# Patient Record
Sex: Female | Born: 1980 | Smoking: Former smoker
Health system: Southern US, Community
[De-identification: ages and names within clinical notes are randomized; demographics above are authoritative.]

---
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
## 2005-09-12 ENCOUNTER — Ambulatory Visit: Payer: Self-pay | Admitting: Internal Medicine

## 2007-02-06 ENCOUNTER — Ambulatory Visit: Payer: Self-pay

## 2007-02-07 ENCOUNTER — Inpatient Hospital Stay: Payer: Self-pay

## 2014-03-30 LAB — LIPID PANEL
CHOLESTEROL: 186 mg/dL (ref 0–200)
HDL: 59 mg/dL (ref 35–70)
LDL Cholesterol: 111 mg/dL
TRIGLYCERIDES: 79 mg/dL (ref 40–160)

## 2014-03-30 LAB — CBC AND DIFFERENTIAL
HCT: 39 % (ref 36–46)
HEMOGLOBIN: 13 g/dL (ref 12.0–16.0)
Platelets: 178 10*3/uL (ref 150–399)
WBC: 7.3 10^3/mL

## 2014-03-30 LAB — BASIC METABOLIC PANEL
BUN: 10 mg/dL (ref 4–21)
CREATININE: 0.9 mg/dL (ref 0.5–1.1)
Glucose: 92 mg/dL
POTASSIUM: 4.1 mmol/L (ref 3.4–5.3)
Sodium: 141 mmol/L (ref 137–147)

## 2014-03-30 LAB — TSH: TSH: 2.12 u[IU]/mL (ref 0.41–5.90)

## 2014-03-30 LAB — HEPATIC FUNCTION PANEL
ALT: 10 U/L (ref 7–35)
AST: 13 U/L (ref 13–35)

## 2014-03-30 LAB — HM PAP SMEAR: HM PAP: NEGATIVE

## 2015-04-01 ENCOUNTER — Other Ambulatory Visit: Payer: Self-pay | Admitting: Family Medicine

## 2015-04-01 DIAGNOSIS — Z308 Encounter for other contraceptive management: Secondary | ICD-10-CM

## 2015-04-01 DIAGNOSIS — Z309 Encounter for contraceptive management, unspecified: Secondary | ICD-10-CM | POA: Insufficient documentation

## 2015-04-01 NOTE — Telephone Encounter (Signed)
LAST CPE 03/30/14

## 2015-06-29 ENCOUNTER — Other Ambulatory Visit: Payer: Self-pay | Admitting: Family Medicine

## 2015-06-29 DIAGNOSIS — Z308 Encounter for other contraceptive management: Secondary | ICD-10-CM

## 2015-06-29 MED ORDER — LEVONORGESTREL-ETHINYL ESTRAD 0.1-20 MG-MCG PO TABS: 1.0000 | ORAL_TABLET | Freq: Every day | ORAL | Status: DC

## 2015-06-29 NOTE — Telephone Encounter (Signed)
Needs refill on ORSYTHIA 0.1-20 MG-MCG tablet 04/01/15 -- Lorie Phenix, MD TAKE 1 TABLET BY MOUTH EVERY DAY  Has an appt Nov 1 CPE  [facility] uses [facility] /dr.  Hermina Barters

## 2015-07-19 DIAGNOSIS — K589 Irritable bowel syndrome without diarrhea: Secondary | ICD-10-CM | POA: Insufficient documentation

## 2015-07-19 DIAGNOSIS — IMO0002 Reserved for concepts with insufficient information to code with codable children: Secondary | ICD-10-CM | POA: Insufficient documentation

## 2015-07-20 ENCOUNTER — Ambulatory Visit (INDEPENDENT_AMBULATORY_CARE_PROVIDER_SITE_OTHER): Payer: BC Managed Care – PPO | Admitting: Family Medicine

## 2015-07-20 ENCOUNTER — Encounter: Payer: Self-pay | Admitting: Family Medicine

## 2015-07-20 VITALS — BP 120/76 | HR 92 | Temp 98.3°F | Resp 16 | Ht 64.0 in | Wt 189.0 lb

## 2015-07-20 DIAGNOSIS — J020 Streptococcal pharyngitis: Secondary | ICD-10-CM | POA: Insufficient documentation

## 2015-07-20 DIAGNOSIS — J029 Acute pharyngitis, unspecified: Secondary | ICD-10-CM

## 2015-07-20 DIAGNOSIS — J309 Allergic rhinitis, unspecified: Secondary | ICD-10-CM | POA: Insufficient documentation

## 2015-07-20 DIAGNOSIS — J3089 Other allergic rhinitis: Secondary | ICD-10-CM | POA: Diagnosis not present

## 2015-07-20 LAB — POCT RAPID STREP A (OFFICE): Rapid Strep A Screen: POSITIVE — AB

## 2015-07-20 MED ORDER — FLUTICASONE PROPIONATE 50 MCG/ACT NA SUSP: 2.0000 | Freq: Every day | NASAL | Status: DC

## 2015-07-20 MED ORDER — CEPHALEXIN 500 MG PO CAPS: 500.0000 mg | ORAL_CAPSULE | Freq: Three times a day (TID) | ORAL | Status: DC

## 2015-07-20 NOTE — Progress Notes (Signed)
Patient ID: Megan Spencer, female   DOB: 04/22/1981, 34 y.o.   MRN: [id number]        Patient: Megan Spencer Female    DOB: 12/09/1980   34 y.o.   MRN: [id number] Visit Date: 07/20/2015  Today's Provider: Lorie PhenixNancy Chandra Feger, MD   Chief Complaint  Patient presents with  . Sore Throat   Subjective:    HPI  Fever: Patient presents with low grade fevers. She has had the fever for 1 day.  Symptoms have been stable. Symptoms associated with the fever include sore throat, and patient denies body aches and chills. Symptoms have been stable since that time. Patient has been sleeping well. Appetite has been completed. Urine output has been completed.  She has tried to alleviate the symptoms with OTC nasal decongestion with minimal relief. Daycare:no. Exposure to tobacco: no. Exposure to someone else at home w/similar symptoms:no. Exposure to someone else at daycare/school/work:no. Patient reports that she has had several strep infections in the past. Patient reports left side of throat is more painful and swollen than right. Patient also reports left ear pain and cough. Patient denies shortness of breath or wheezing.     Has history of strep in the past.   No current treatment for allergies.   Results for orders placed or performed in visit on 07/20/15  POCT rapid strep A  Result Value Ref Range   Rapid Strep A Screen Positive (A) Negative     Allergies  Allergen Reactions  . Codeine   . Neomycin-Bacitracin Zn-Polymyx   . Sulfa Antibiotics   . Tape    Previous Medications   LEVONORGESTREL-ETHINYL ESTRADIOL (ORSYTHIA) 0.1-20 MG-MCG TABLET    Take 1 tablet by mouth daily.    Review of Systems  Constitutional: Positive for fever.  HENT: Positive for ear pain, postnasal drip, rhinorrhea and trouble swallowing.   Respiratory: Positive for cough.     Social History  Substance Use Topics  . Smoking status: Former Smoker    Quit date: 09/17/2001  . Smokeless tobacco: Not on file  .  Alcohol Use: No   Objective:   BP 120/76 mmHg  Pulse 92  Temp(Src) 98.3 F (36.8 C) (Oral)  Resp 16  Ht 5\' 4"  (1.626 m)  Wt 189 lb (85.73 kg)  BMI 32.43 kg/m2  SpO2 99%  LMP 06/30/2015 (Exact Date)  Physical Exam  Constitutional: She is oriented to person, place, and time. She appears well-developed and well-nourished.  HENT:  Head: Normocephalic and atraumatic.  Right Ear: Tympanic membrane and external ear normal.  Left Ear: Tympanic membrane and external ear normal.  Nose: Mucosal edema and rhinorrhea present. Right sinus exhibits maxillary sinus tenderness. Left sinus exhibits maxillary sinus tenderness.  Mouth/Throat: Uvula is midline. Oropharyngeal exudate present.  2 plus tonsils.   Eyes: Conjunctivae and EOM are normal. Pupils are equal, round, and reactive to light.  Neck: Normal range of motion. Neck supple.  Cardiovascular: Normal rate and regular rhythm.   Pulmonary/Chest: Effort normal and breath sounds normal. She has no wheezes. She has no rales.  Neurological: She is alert and oriented to person, place, and time.      Assessment & Plan:      1. Sore throat Strep positive. - POCT rapid strep A Results for orders placed or performed in visit on 07/20/15  POCT rapid strep A  Result Value Ref Range   Rapid Strep A Screen Positive (A) Negative    2. Strep throat Condition is worsening.  Will start medication for better control.  Patient instructed to call back if condition worsens or does not improve.    - cephALEXin (KEFLEX) 500 MG capsule; Take 1 capsule (500 mg total) by mouth 3 (three) times daily.  Dispense: 30 capsule; Refill: 0  3. Other allergic rhinitis Condition is worsening. Will start medication for better control.  Patient instructed to call back if condition worsens or does not improve.    - fluticasone (FLONASE) 50 MCG/ACT nasal spray; Place 2 sprays into both nostrils daily.  Dispense: 16 g; Refill: 6  Lorie Phenix, MD       Lorie Phenix, MD  [facility]

## 2015-07-20 NOTE — Progress Notes (Deleted)
Subjective:     Patient ID: Megan Spencer, female   DOB: 10/21/1980, 34 y.o.   MRN: [id number]  HPI Fever: Patient presents with {fever:5004}. She has had the fever for {numbers; 0-10:33138} {time units:11}.  Symptoms have been {clinical course - history:17::"unchanged"}. Symptoms associated with the fever include {fever associated symptoms:12577}, and patient denies {fever associated symptoms:12577}.. Symptoms are worse at {time of day:12576}.  Symptoms have been {clinical course - history:17::"unchanged"} since that time. Patient has been {sleep:10079}. Appetite has been {assess:31394}. Urine output has been {assess:31394}. She has associated symptoms of   She has tried to alleviate the symptoms with {fever alleviating factors:12578} with {relief:12621} relief.   The patient has {fever comorbidities:12579}. Daycare:{yes/no:63}. Exposure to tobacco: {yes/no:63}. Exposure to someone else at home w/similar symptoms:{yes/no:63} Exposure to someone else at daycare/school/work:{yes/no:63}.    Review of Systems  Constitutional: Negative.   HENT: Negative.   Eyes: Negative.   Respiratory: Negative.        Objective:   Physical Exam     Assessment:     ***    Plan:     ***

## 2015-07-23 ENCOUNTER — Telehealth: Payer: Self-pay | Admitting: Family Medicine

## 2015-07-23 NOTE — Telephone Encounter (Signed)
No resistance to strep with Keflex. White can be debris in tonsils that can become inflamed.  Would add warm salt water gargles and call if not improved.  Thanks.

## 2015-07-23 NOTE — Telephone Encounter (Signed)
Pt stated that she had an OV on Tuesday 07/20/15 and has been taking the meds as directed but she still has white spots on the back of her throat and still hurts and hard to swallow. Pt wanted to know if she should try anything else. [facility] [address] Lynford Humphreyhanks TNP

## 2015-07-23 NOTE — Telephone Encounter (Signed)
LMTCB 07/23/2015  Thanks,   -Laura  

## 2015-07-24 ENCOUNTER — Ambulatory Visit (INDEPENDENT_AMBULATORY_CARE_PROVIDER_SITE_OTHER): Payer: BC Managed Care – PPO | Admitting: Family Medicine

## 2015-07-24 ENCOUNTER — Encounter: Payer: Self-pay | Admitting: Family Medicine

## 2015-07-24 VITALS — BP 136/80 | HR 80 | Temp 98.3°F | Resp 16 | Wt 189.0 lb

## 2015-07-24 DIAGNOSIS — J039 Acute tonsillitis, unspecified: Secondary | ICD-10-CM

## 2015-07-24 MED ORDER — NYSTATIN 100000 UNIT/ML MT SUSP: 5.0000 mL | Freq: Four times a day (QID) | OROMUCOSAL | Status: DC

## 2015-07-24 NOTE — Progress Notes (Signed)
Subjective:     Patient ID: Salomon Fickrika P Icenhour, female   DOB: 06/23/1981, 34 y.o.   MRN: [id number]  HPI  Chief Complaint  Patient presents with  . Sore Throat    Still has white spots on the back of her throat since started Keflex on Tuesday.  . Cough  Here in f/u of o.v.of 11/1 for strep throat. Concerned about new white spots and coated tongue. States fevers and fatigue have abated and sore throat has improved.   Review of Systems     Objective:   Physical Exam  Constitutional: She appears well-developed and well-nourished. No distress.  Ears: T.M's intact without inflammation Sinuses:  Throat: marked enlarged tonsils with white exudate/erythema. Tongue is white in appearance but does not rub off with tongue blade (patient states she brushed her tongue this AM. Neck: + left anterior cervical node Lungs: clear     Assessment:    1. Tonsillitis: will cover for thrush  - Epstein-Barr virus VCA antibody panel - nystatin (MYCOSTATIN) 100000 UNIT/ML suspension; Take 5 mLs (500,000 Units total) by mouth 4 (four) times daily.  Dispense: 60 mL; Refill: 0    Plan:    Complete antibiotic. Obtain labs if not continuing to improve.

## 2015-07-24 NOTE — Patient Instructions (Signed)
We will call you with lab results. Complete antibiotic.

## 2015-07-26 NOTE — Telephone Encounter (Signed)
Pt made OV for 07/24/2015  Thanks,   -Vernona RiegerLaura

## 2015-07-28 ENCOUNTER — Telehealth: Payer: Self-pay | Admitting: Family Medicine

## 2015-07-28 LAB — EPSTEIN-BARR VIRUS VCA ANTIBODY PANEL
EBV EARLY ANTIGEN AB, IGG: 120 U/mL — AB (ref 0.0–8.9)
EBV NA IgG: >600 U/mL — ABNORMAL HIGH (ref 0.0–17.9)
EBV VCA IgG: 589 U/mL — ABNORMAL HIGH (ref 0.0–17.9)

## 2015-07-28 NOTE — Telephone Encounter (Signed)
Pt stated she was returning a call from yesterday. Pt stated the person didn't their name on the message just call the office. Thanks TNP

## 2015-07-28 NOTE — Telephone Encounter (Signed)
We received a message from the call center saying that patient was not any better. Patient reports that she was seen on Saturday and was treated for Mono. Patient reports that she just had her labs done yesterday morning. Advised patient that we will advise her once lab results come in.

## 2015-08-03 ENCOUNTER — Encounter: Payer: Self-pay | Admitting: Family Medicine

## 2015-08-03 ENCOUNTER — Ambulatory Visit (INDEPENDENT_AMBULATORY_CARE_PROVIDER_SITE_OTHER): Payer: BC Managed Care – PPO | Admitting: Family Medicine

## 2015-08-03 VITALS — BP 124/60 | HR 80 | Temp 98.3°F | Resp 16 | Wt 188.0 lb

## 2015-08-03 DIAGNOSIS — Z0000 Encounter for general adult medical examination without abnormal findings: Secondary | ICD-10-CM | POA: Diagnosis not present

## 2015-08-03 DIAGNOSIS — Z308 Encounter for other contraceptive management: Secondary | ICD-10-CM

## 2015-08-03 DIAGNOSIS — Z124 Encounter for screening for malignant neoplasm of cervix: Secondary | ICD-10-CM

## 2015-08-03 MED ORDER — LEVONORGESTREL-ETHINYL ESTRAD 0.1-20 MG-MCG PO TABS: 1.0000 | ORAL_TABLET | Freq: Every day | ORAL | Status: DC

## 2015-08-03 NOTE — Progress Notes (Signed)
Patient ID: Megan Spencer, female   DOB: 08/11/1981, 34 y.o.   MRN: [id number]        Patient: Megan Spencer, Female    DOB: 04/04/1981, 34 y.o.   MRN: [id number] Visit Date: 08/03/2015  Today's Provider: Lorie PhenixNancy Jacobi Ryant, MD   Chief Complaint  Patient presents with  . Annual Exam   Subjective:    Annual physical exam Megan Spencer is a 34 y.o. female who presents today for health maintenance and complete physical. She feels fairly well. Pt reports still having trouble with a cough.  She reports exercising some, but stays very active. She reports she is sleeping well.  -----------------------------------------------------------------   Review of Systems  Constitutional: Negative.   HENT: Negative.   Respiratory: Positive for cough.   Cardiovascular: Negative.   Gastrointestinal: Negative.   Endocrine: Negative.   Genitourinary: Negative.   Musculoskeletal: Positive for arthralgias.  Skin: Negative.   Allergic/Immunologic: Negative.   Neurological: Negative.   Hematological: Negative.   Psychiatric/Behavioral: Negative.     Social History She  reports that she quit smoking about 13 years ago. She does not have any smokeless tobacco history on file. She reports that she does not drink alcohol or use illicit drugs. Social History   Social History  . Marital Status: Married    Spouse Name: N/A  . Number of Children: N/A  . Years of Education: N/A   Social History Main Topics  . Smoking status: Former Smoker    Quit date: 09/17/2001  . Smokeless tobacco: None  . Alcohol Use: No  . Drug Use: No  . Sexual Activity: Not Asked   Other Topics Concern  . None   Social History Narrative    Patient Active Problem List   Diagnosis Date Noted  . Annual physical exam 08/03/2015  . Strep throat 07/20/2015  . Allergic rhinitis 07/20/2015  . Abnormal cervical Pap smear with positive HPV DNA test 07/19/2015  . IBS (irritable bowel syndrome) 07/19/2015  .  Contraception management 04/01/2015    Past Surgical History  Procedure Laterality Date  . Cesarean section  2008  . Knee surgery Right 1998    arthroscopy    Family History  Family Status  Relation Status Death Age  . Mother Alive   . Father Deceased [id number]    lung cancer  . Sister Alive    Her family history includes Psoriasis in her sister.    Allergies  Allergen Reactions  . Codeine   . Neomycin-Bacitracin Zn-Polymyx   . Sulfa Antibiotics   . Tape     Previous Medications   FLUTICASONE (FLONASE) 50 MCG/ACT NASAL SPRAY    Place 2 sprays into both nostrils daily.    Patient Care Team: Lorie PhenixNancy Tywone Bembenek, MD as PCP - General (Family Medicine)     Objective:   Vitals: BP 124/60 mmHg  Pulse 80  Temp(Src) 98.3 F (36.8 C) (Oral)  Resp 16  Wt 188 lb (85.276 kg)  LMP 07/21/2015 (Exact Date)   Physical Exam  Constitutional: She is oriented to person, place, and time. She appears well-developed and well-nourished.  HENT:  Head: Normocephalic and atraumatic.  Right Ear: External ear normal.  Left Ear: External ear normal.  Nose: Nose normal.  Mouth/Throat: Oropharynx is clear and moist.  Eyes: Conjunctivae and EOM are normal. Pupils are equal, round, and reactive to light.  Neck: Trachea normal and normal range of motion. Neck supple. Carotid bruit is not present. No thyromegaly present.  Cardiovascular: Normal  rate, regular rhythm, normal heart sounds and intact distal pulses.   Pulmonary/Chest: Effort normal and breath sounds normal.  Abdominal: Soft. Bowel sounds are normal. She exhibits no distension. There is no tenderness.  Genitourinary: Rectum normal, vagina normal and uterus normal. Guaiac negative stool. No breast swelling, tenderness, discharge or bleeding. Cervix exhibits no discharge.  Musculoskeletal: Normal range of motion.  Lymphadenopathy:    She has no cervical adenopathy.    She has no axillary adenopathy.  Neurological: She is alert and oriented to  person, place, and time. She has normal reflexes.  Skin: Skin is warm and dry.  Psychiatric: She has a normal mood and affect. Her speech is normal and behavior is normal. Judgment and thought content normal. Cognition and memory are normal.     Depression Screen No flowsheet data found.    Assessment & Plan:     Routine Health Maintenance and Physical Exam  Exercise Activities and Dietary recommendations Goals    None       There is no immunization history on file for this patient.  Health Maintenance  Topic Date Due  . HIV Screening  02/18/1994  . TETANUS/TDAP  02/18/1998  . INFLUENZA VACCINE  04/19/2015  . PAP SMEAR  03/30/2017      Discussed health benefits of physical activity, and encouraged her to engage in regular exercise appropriate for her age and condition.   1. Annual physical exam As above.  May have has Mono. Will get flu shot next week.    2. Cervical cancer screening Done today.  - Pap IG and HPV (high risk) DNA detection  3. Encounter for other contraceptive management Rx sent.  - levonorgestrel-ethinyl estradiol (ORSYTHIA) 0.1-20 MG-MCG tablet; Take 1 tablet by mouth daily.  Dispense: 28 tablet; Refill: 11   Lorie Phenix, MD    --------------------------------------------------------------------

## 2015-08-06 ENCOUNTER — Telehealth: Payer: Self-pay

## 2015-08-06 LAB — PAP IG AND HPV HIGH-RISK
HPV, HIGH-RISK: NEGATIVE
PAP SMEAR COMMENT: 0

## 2015-08-06 NOTE — Telephone Encounter (Signed)
Advised pt of lab results. Pt verbally acknowledges understanding. Emily Drozdowski, CMA   

## 2015-08-06 NOTE — Telephone Encounter (Signed)
-----   Message from Lorie PhenixNancy Maloney, MD sent at 08/06/2015  9:34 AM EST ----- Pap  Normal. HPV negative. Thanks.

## 2015-08-24 ENCOUNTER — Ambulatory Visit (INDEPENDENT_AMBULATORY_CARE_PROVIDER_SITE_OTHER): Payer: BC Managed Care – PPO | Admitting: Family Medicine

## 2015-08-24 DIAGNOSIS — Z23 Encounter for immunization: Secondary | ICD-10-CM

## 2016-01-11 ENCOUNTER — Other Ambulatory Visit: Payer: Self-pay | Admitting: Otolaryngology

## 2016-01-11 DIAGNOSIS — R42 Dizziness and giddiness: Secondary | ICD-10-CM

## 2016-07-11 ENCOUNTER — Encounter: Payer: Self-pay | Admitting: Physician Assistant

## 2016-07-11 ENCOUNTER — Ambulatory Visit (INDEPENDENT_AMBULATORY_CARE_PROVIDER_SITE_OTHER): Payer: BC Managed Care – PPO | Admitting: Physician Assistant

## 2016-07-11 VITALS — BP 138/88 | HR 76 | Temp 99.0°F | Resp 16 | Wt 190.0 lb

## 2016-07-11 DIAGNOSIS — H15102 Unspecified episcleritis, left eye: Secondary | ICD-10-CM | POA: Diagnosis not present

## 2016-07-11 MED ORDER — OFLOXACIN 0.3 % OP SOLN
1.0000 [drp] | Freq: Four times a day (QID) | OPHTHALMIC | 0 refills | Status: AC
Start: 2016-07-11 — End: 2016-07-18

## 2016-07-11 NOTE — Progress Notes (Addendum)
Patient: Megan Spencer Female    DOB: 12/08/1980   35 y.o.   MRN: [id number] Visit Date: 07/11/2016  Today's Provider: Trey SailorsAdriana M Pollak, PA-C   Chief Complaint  Patient presents with  . Eye Problem    Started about 10 days ago.    Subjective:    Eye Problem   The left eye is affected. This is a new problem. The current episode started 1 to 4 weeks ago. The problem has been waxing and waning. There was no injury mechanism. There is no known exposure to pink eye. She does not wear contacts. Associated symptoms include an eye discharge, eye redness, itching and photophobia. Pertinent negatives include no blurred vision, double vision or foreign body sensation. She has tried eye drops for the symptoms. The treatment provided no relief.   She describes a localized redness associated with itching and watering. She denies purulent drainage or morning crusting. No change in vision. Some photophobia that has since dissipated. No foreign object feeling in eyes. Problem waxing and waning for 10 days. Has tried her son's tobramcyin drops without relief.  Allergies  Allergen Reactions  . Codeine   . Neomycin-Bacitracin Zn-Polymyx   . Sulfa Antibiotics   . Tape      Current Outpatient Prescriptions:  .  levonorgestrel-ethinyl estradiol (ORSYTHIA) 0.1-20 MG-MCG tablet, Take 1 tablet by mouth daily., Disp: 28 tablet, Rfl: 11 .  ofloxacin (OCUFLOX) 0.3 % ophthalmic solution, Place 1 drop into the left eye 4 (four) times daily., Disp: 1.4 mL, Rfl: 0  Review of Systems  Constitutional: Negative.   HENT: Positive for tinnitus. Negative for congestion, ear discharge, ear pain, nosebleeds, postnasal drip, rhinorrhea, sinus pressure, sneezing, sore throat and trouble swallowing.   Eyes: Positive for photophobia, pain, discharge, redness and itching. Negative for blurred vision, double vision and visual disturbance.  Allergic/Immunologic: Positive for environmental allergies.  Neurological:  Negative for dizziness, light-headedness and headaches.    Social History  Substance Use Topics  . Smoking status: Former Smoker    Quit date: 09/17/2001  . Smokeless tobacco: Not on file  . Alcohol use No   Objective:   BP 138/88 (BP Location: Left Arm, Patient Position: Sitting, Cuff Size: Large)   Pulse 76   Temp 99 F (37.2 C) (Oral)   Resp 16   Wt 190 lb (86.2 kg)   LMP 06/21/2016   BMI 32.61 kg/m   Physical Exam  Constitutional: She appears well-developed and well-nourished.  Eyes: EOM and lids are normal. Pupils are equal, round, and reactive to light. Lids are everted and swept, no foreign bodies found. Right eye exhibits no discharge, no exudate and no hordeolum. No foreign body present in the right eye. Left eye exhibits no discharge, no exudate and no hordeolum. No foreign body present in the left eye. Right conjunctiva is not injected. Right conjunctiva has no hemorrhage. Left conjunctiva is injected. Left conjunctiva has no hemorrhage.    Lymphadenopathy:    She has no cervical adenopathy.        Assessment & Plan:      Problem List Items Addressed This Visit    None    Visit Diagnoses    Episcleritis of left eye    -  Primary   Relevant Medications   ofloxacin (OCUFLOX) 0.3 % ophthalmic solution     Patient is 35 y/o presenting with episcleritis. Explained this is self limiting in nature, can be treated with lubricating eye drops. Patient  does work as Art gallery manager, so have provided antibiotic eye drops if patient develops purulent drainage, morning crusting. Patient to call back if not improving.   Return if symptoms worsen or fail to improve.  The entirety of the information documented in the History of Present Illness, Review of Systems and Physical Exam were personally obtained by me. Portions of this information were initially documented by Kavin Leech, CMA and reviewed by me for thoroughness and accuracy.    Patient Instructions    Scleritis and Episcleritis The outer part of the eyeball is covered with a tough fibrous covering called the sclera. It is the white part of the eye. This tough covering also has a thin membrane lying on top of it called the episclera.   When the sclera becomes red and sore (inflamed), it is called scleritis.  When the episclera becomes inflamed, it is called episcleritis. CAUSES   Scleritis is usually more severe and is associated with autoimmune diseases such as:  Rheumatoid arthritis.  Inflammations of the bowel such as Crohn's Disease (regional enteritis).  Ulcerative colitis.  Episcleritis usually has no known cause. SYMPTOMS  Both scleritis and episcleritis cause red patches or a nodule on the eye. DIAGNOSIS  This condition should be examined by an ophthalmologist. This is because very strong medications that have side effects to the body and eye may be required to treat severe attacks. Further investigations into the patient's general health may be necessary. TREATMENT   Episcleritis tends to get better without treatment within a week or two.  Scleritis is more severe. Often, your caregiver will prescribe steroids by mouth (orally) or as drops in the eye. This treatment helps lessen the redness and soreness (inflammation). HOME CARE INSTRUCTIONS   Take all medications as directed.  Keep your follow-up appointments as directed.  Avoid irritation of the involved eye(s).  Stop using hard or soft contact lenses until your caregiver tells you that it is safe to use them. SEEK MEDICAL CARE IF:   Redness or irritation gets worse.  You develop pain or sensitivity to light.  You develop any change in vision in the involved eye(s).   This information is not intended to replace advice given to you by your health care provider. Make sure you discuss any questions you have with your health care provider.   Document Released: 08/29/2001 Document Revised: 11/27/2011 Document  Reviewed: 12/31/2008 Elsevier Interactive Patient Education 2016 [employer].          Trey Sailors, PA-C  [facility]

## 2016-07-11 NOTE — Patient Instructions (Signed)
Scleritis and Episcleritis  The outer part of the eyeball is covered with a tough fibrous covering called the sclera. It is the white part of the eye. This tough covering also has a thin membrane lying on top of it called the episclera.   · When the sclera becomes red and sore (inflamed), it is called scleritis.  · When the episclera becomes inflamed, it is called episcleritis.  CAUSES   · Scleritis is usually more severe and is associated with autoimmune diseases such as:  ¨ Rheumatoid arthritis.  ¨ Inflammations of the bowel such as Crohn's Disease (regional enteritis).  ¨ Ulcerative colitis.  · Episcleritis usually has no known cause.  SYMPTOMS   Both scleritis and episcleritis cause red patches or a nodule on the eye.  DIAGNOSIS   This condition should be examined by an ophthalmologist. This is because very strong medications that have side effects to the body and eye may be required to treat severe attacks. Further investigations into the patient's general health may be necessary.  TREATMENT   · Episcleritis tends to get better without treatment within a week or two.  · Scleritis is more severe. Often, your caregiver will prescribe steroids by mouth (orally) or as drops in the eye. This treatment helps lessen the redness and soreness (inflammation).  HOME CARE INSTRUCTIONS   · Take all medications as directed.  · Keep your follow-up appointments as directed.  · Avoid irritation of the involved eye(s).  · Stop using hard or soft contact lenses until your caregiver tells you that it is safe to use them.  SEEK MEDICAL CARE IF:   · Redness or irritation gets worse.  · You develop pain or sensitivity to light.  · You develop any change in vision in the involved eye(s).     This information is not intended to replace advice given to you by your health care provider. Make sure you discuss any questions you have with your health care provider.     Document Released: 08/29/2001 Document Revised: 11/27/2011 Document  Reviewed: 12/31/2008  Elsevier Interactive Patient Education ©2016 [employer]

## 2016-07-13 ENCOUNTER — Ambulatory Visit (INDEPENDENT_AMBULATORY_CARE_PROVIDER_SITE_OTHER): Payer: BC Managed Care – PPO | Admitting: Physician Assistant

## 2016-07-13 ENCOUNTER — Encounter: Payer: Self-pay | Admitting: Physician Assistant

## 2016-07-13 VITALS — BP 138/76 | HR 72 | Temp 98.8°F | Resp 16 | Wt 191.0 lb

## 2016-07-13 DIAGNOSIS — H578 Other specified disorders of eye and adnexa: Secondary | ICD-10-CM

## 2016-07-13 DIAGNOSIS — H5789 Other specified disorders of eye and adnexa: Secondary | ICD-10-CM

## 2016-07-13 NOTE — Progress Notes (Addendum)
Patient: Megan Spencer Female    DOB: 08/10/1981   35 y.o.   MRN: [id number] Visit Date: 07/13/2016  Today's Provider: Trey SailorsAdriana M Pollak, PA-C   Chief Complaint  Patient presents with  . Eye Problem   Subjective:    Eye Problem   The left eye is affected. This is a new problem. The current episode started 1 to 4 weeks ago. The problem occurs constantly. The problem has been gradually worsening. There was no injury mechanism. The pain is at a severity of 1/10. The pain is mild. There is no known exposure to pink eye. She does not wear contacts. Associated symptoms include eye redness and photophobia. Pertinent negatives include no blurred vision, eye discharge, double vision, fever, foreign body sensation, itching, nausea, recent URI or vomiting. She has tried eye drops for the symptoms. The treatment provided no relief.   Patient is 35 y/o female who was seen in clinic two days ago for similar symptoms of left eye redness intermittently, associated with itching and watery eye, but no pain. Patient was given some ofloxacin drops to use in case redness progressed to conjunctivitis symptoms. Patient is presenting today with worsening left eye redness that is sensitive to light and not responding to antibiotic drops. Patient had to drive yesterday covering her left eye. Not painful to touch but some mild pain present or feeling of foreign body in the eye. No visual change. Right eye is unaffected. No trauma or other changes since our last visit.     Allergies  Allergen Reactions  . Codeine   . Neomycin-Bacitracin Zn-Polymyx   . Sulfa Antibiotics   . Tape      Current Outpatient Prescriptions:  .  levonorgestrel-ethinyl estradiol (ORSYTHIA) 0.1-20 MG-MCG tablet, Take 1 tablet by mouth daily., Disp: 28 tablet, Rfl: 11 .  ofloxacin (OCUFLOX) 0.3 % ophthalmic solution, Place 1 drop into the left eye 4 (four) times daily., Disp: 1.4 mL, Rfl: 0  Review of Systems  Constitutional:  Negative.  Negative for fever.  Eyes: Positive for photophobia, pain (Mildly painful) and redness. Negative for blurred vision, double vision, discharge, itching and visual disturbance.  Gastrointestinal: Negative.  Negative for nausea and vomiting.    Social History  Substance Use Topics  . Smoking status: Former Smoker    Quit date: 09/17/2001  . Smokeless tobacco: Not on file  . Alcohol use No   Objective:   BP 138/76 (BP Location: Left Arm, Patient Position: Sitting, Cuff Size: Large)   Pulse 72   Temp 98.8 F (37.1 C) (Oral)   Resp 16   Wt 191 lb (86.6 kg)   LMP 06/21/2016   BMI 32.79 kg/m   Physical Exam  Constitutional: She appears well-developed and well-nourished. No distress.  Eyes: EOM and lids are normal. Pupils are equal, round, and reactive to light. Lids are everted and swept, no foreign bodies found. Right eye exhibits no discharge and no exudate. No foreign body present in the right eye. Left eye exhibits no discharge and no exudate. No foreign body present in the left eye. Right conjunctiva is not injected. Right conjunctiva has no hemorrhage. Left conjunctiva is injected. Left conjunctiva has no hemorrhage. Right pupil is round. Left pupil is round. Pupils are equal.    Lymphadenopathy:    She has no cervical adenopathy.  Skin: She is not diaphoretic.        Assessment & Plan:      Problem List  Items Addressed This Visit    None    Visit Diagnoses    Redness of eye, left    -  Primary   Relevant Orders   Ambulatory referral to Ophthalmology     Patient is presenting today with marked worsening of left eye redness and photophobia in comparison to last visit. Will refer urgently to opthalmology today. Patient has appointment at 4:00 PM at [facility]   Return if symptoms worsen or fail to improve.  The entirety of the information documented in the History of Present Illness, Review of Systems and Physical Exam were personally obtained by me.  Portions of this information were initially documented by Kavin Leech, CMA and reviewed by me for thoroughness and accuracy.    Patient Instructions    Allergic Conjunctivitis Allergic conjunctivitis is inflammation of the clear membrane that covers the white part of your eye and the inner surface of your eyelid (conjunctiva), and it is caused by allergies. The blood vessels in the conjunctiva become inflamed, and this causes the eye to become red or pink, and it often causes itchiness in the eye. Allergic conjunctivitis cannot be spread by one person to another person (noncontagious). CAUSES This condition is caused by an allergic reaction. Common causes of an allergic reaction (allergens) include:  Dust.  Pollen.  Mold.  Animal dander or secretions. RISK FACTORS This condition is more likely to develop if you are exposed to high levels of allergens that cause the allergic reaction. This might include being outdoors when air pollen levels are high or being around animals that you are allergic to. SYMPTOMS Symptoms of this condition may include:  Eye redness.  Tearing of the eyes.  Watery eyes.  Itchy eyes.  Burning feeling in the eyes.  Clear drainage from the eyes.  Swollen eyelids. DIAGNOSIS This condition may be diagnosed by medical history and physical exam. If you have drainage from your eyes, it may be tested to rule out other causes of conjunctivitis. TREATMENT Treatment for this condition often includes medicines. These may be eye drops, ointments, or oral medicines. They may be prescription medicines or over-the-counter medicines. HOME CARE INSTRUCTIONS  Take or apply medicines only as directed by your health care provider.  Do not touch or rub your eyes.  Do not wear contact lenses until the inflammation is gone. Wear glasses instead.  Do not wear eye makeup until the inflammation is gone.  Apply a cool, clean washcloth to your eye for 10-20 minutes, 3-4  times a day.  Try to avoid whatever allergen is causing the allergic reaction. SEEK MEDICAL CARE IF:  Your symptoms get worse.  You have pus draining from your eye.  You have new symptoms.  You have a fever.   This information is not intended to replace advice given to you by your health care provider. Make sure you discuss any questions you have with your health care provider.   Document Released: 11/25/2002 Document Revised: 09/25/2014 Document Reviewed: 06/16/2014 Elsevier Interactive Patient Education 2016 Elsevier Inc.          Trey Sailors, PA-C  [facility]

## 2016-07-13 NOTE — Patient Instructions (Signed)
Allergic Conjunctivitis Allergic conjunctivitis is inflammation of the clear membrane that covers the white part of your eye and the inner surface of your eyelid (conjunctiva), and it is caused by allergies. The blood vessels in the conjunctiva become inflamed, and this causes the eye to become red or pink, and it often causes itchiness in the eye. Allergic conjunctivitis cannot be spread by one person to another person (noncontagious). CAUSES This condition is caused by an allergic reaction. Common causes of an allergic reaction (allergens) include:  Dust.  Pollen.  Mold.  Animal dander or secretions. RISK FACTORS This condition is more likely to develop if you are exposed to high levels of allergens that cause the allergic reaction. This might include being outdoors when air pollen levels are high or being around animals that you are allergic to. SYMPTOMS Symptoms of this condition may include:  Eye redness.  Tearing of the eyes.  Watery eyes.  Itchy eyes.  Burning feeling in the eyes.  Clear drainage from the eyes.  Swollen eyelids. DIAGNOSIS This condition may be diagnosed by medical history and physical exam. If you have drainage from your eyes, it may be tested to rule out other causes of conjunctivitis. TREATMENT Treatment for this condition often includes medicines. These may be eye drops, ointments, or oral medicines. They may be prescription medicines or over-the-counter medicines. HOME CARE INSTRUCTIONS  Take or apply medicines only as directed by your health care provider.  Do not touch or rub your eyes.  Do not wear contact lenses until the inflammation is gone. Wear glasses instead.  Do not wear eye makeup until the inflammation is gone.  Apply a cool, clean washcloth to your eye for 10-20 minutes, 3-4 times a day.  Try to avoid whatever allergen is causing the allergic reaction. SEEK MEDICAL CARE IF:  Your symptoms get worse.  You have pus draining  from your eye.  You have new symptoms.  You have a fever.   This information is not intended to replace advice given to you by your health care provider. Make sure you discuss any questions you have with your health care provider.   Document Released: 11/25/2002 Document Revised: 09/25/2014 Document Reviewed: 06/16/2014 Elsevier Interactive Patient Education 2016 Elsevier Inc.  

## 2016-08-04 ENCOUNTER — Encounter: Payer: Self-pay | Admitting: Physician Assistant

## 2016-08-04 ENCOUNTER — Ambulatory Visit (INDEPENDENT_AMBULATORY_CARE_PROVIDER_SITE_OTHER): Payer: BC Managed Care – PPO | Admitting: Physician Assistant

## 2016-08-04 VITALS — BP 134/84 | HR 76 | Temp 98.6°F | Resp 16 | Ht 64.0 in | Wt 194.0 lb

## 2016-08-04 DIAGNOSIS — H209 Unspecified iridocyclitis: Secondary | ICD-10-CM | POA: Diagnosis not present

## 2016-08-04 DIAGNOSIS — F419 Anxiety disorder, unspecified: Secondary | ICD-10-CM

## 2016-08-04 DIAGNOSIS — Z0000 Encounter for general adult medical examination without abnormal findings: Secondary | ICD-10-CM | POA: Diagnosis not present

## 2016-08-04 DIAGNOSIS — Z124 Encounter for screening for malignant neoplasm of cervix: Secondary | ICD-10-CM

## 2016-08-04 DIAGNOSIS — Z308 Encounter for other contraceptive management: Secondary | ICD-10-CM | POA: Diagnosis not present

## 2016-08-04 MED ORDER — LEVONORGESTREL-ETHINYL ESTRAD 0.1-20 MG-MCG PO TABS: 1.0000 | ORAL_TABLET | Freq: Every day | ORAL | 11 refills | Status: DC

## 2016-08-04 NOTE — Patient Instructions (Signed)

## 2016-08-04 NOTE — Progress Notes (Addendum)
Patient: Megan Spencer, Female    DOB: 10/09/1980, 35 y.o.   MRN: [id number] Visit Date: 08/07/2016  Today's Provider: Trey SailorsAdriana M Pollak, PA-C   Chief Complaint  Patient presents with  . Annual Exam   Subjective:    Annual physical exam Megan Spencer is a 35 y.o. female who presents today for health maintenance and complete physical. She feels well. She reports not exercising. She reports she is sleeping well.  Patient was recently seen in the office with a red left eye. Urgent referral to opthalmology revealed iritis. Patient has been taking steroid eye drops with marked improvement. Patient reports she had discussion with ophthalmologist who told her iritis may be 2/2 autoimmune disease if it continues to happen. She is no longer photophobic or phonphobic.   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 She has been seen by Dr. Bud Facereighton Vaught for this. Not currently on a diuretic, treating disease with diet and lifestyle modifications.  Patient has a history of positive HPV in 2013. Since then, she has received at her request yearly cotesting.   Patient reports she is a monogamous relationship with a female for the past three years. She is sexually active. She has one boy, aged 489. Patient is taking OCP without problems, she needs a refill today.  She denies smoking, drinking, and drugs. No smokeless tobacco.  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 She reports that her class is extremely difficult this year and she doesn't get a lot of support.  -----------------------------------------------------------------   Review of Systems  Constitutional: Negative.   HENT: Positive for tinnitus.   Eyes: Negative.   Respiratory: Negative.   Cardiovascular: Negative.   Gastrointestinal: Negative.   Genitourinary: Negative.   Musculoskeletal: Negative.   Skin: Negative.     Allergic/Immunologic: Negative.   Neurological: Negative.   Hematological: Negative.   Psychiatric/Behavioral: Positive for agitation.    Social History      She  reports that she quit smoking about 14 years ago. She does not have any smokeless tobacco history on file. She reports that she does not drink alcohol or use drugs.       Social History   Social History  . Marital status: Married    Spouse name: N/A  . Number of children: N/A  . Years of education: N/A   Social History Main Topics  . Smoking status: Former Smoker    Quit date: 09/17/2001  . Smokeless tobacco: None  . Alcohol use No  . Drug use: No  . Sexual activity: Not Asked   Other Topics Concern  . None   Social History Narrative  . None    No past medical history on file.   Patient Active Problem List   Diagnosis Date Noted  . Annual physical exam 08/03/2015  . Allergic rhinitis 07/20/2015  . Abnormal cervical Pap smear with positive HPV DNA test 07/19/2015  . IBS (irritable bowel syndrome) 07/19/2015    Past Surgical History:  Procedure Laterality Date  . CESAREAN SECTION  2008  . KNEE SURGERY Right 1998   arthroscopy    Family History        Family Status  Relation Status  . Mother Alive  . Father Deceased at age 37   lung cancer  . Sister Alive  . Maternal Aunt Deceased  . Maternal Grandmother  Her family history includes Cancer in her father; Cervical cancer in her maternal aunt; Fibroids in her mother; Psoriasis in her sister; Ulcerative colitis in her maternal grandmother and mother; Uterine cancer in her maternal aunt.     Allergies  Allergen Reactions  . Codeine   . Neomycin-Bacitracin Zn-Polymyx   . Sulfa Antibiotics   . Tape      Current Outpatient Prescriptions:  .  levonorgestrel-ethinyl estradiol (ORSYTHIA) 0.1-20 MG-MCG tablet, Take 1 tablet by mouth daily., Disp: 28 tablet, Rfl: 11 .  prednisoLONE acetate (PRED FORTE) 1 % ophthalmic suspension, , Disp: ,  Rfl:    Patient Care Team: Trey SailorsAdriana M Pollak, PA-C as PCP - General (Physician Assistant)      Objective:   Vitals: BP 134/84 (BP Location: Left Arm, Patient Position: Sitting, Cuff Size: Large)   Pulse 76   Temp 98.6 F (37 C) (Oral)   Resp 16   Ht 5\' 4"  (1.626 m)   Wt 194 lb (88 kg)   LMP 07/26/2016   BMI 33.30 kg/m    Physical Exam  Constitutional: She is oriented to person, place, and time. She appears well-developed and well-nourished.  HENT:  Right Ear: External ear normal.  Left Ear: External ear normal.  Mouth/Throat: Oropharynx is clear and moist. No oropharyngeal exudate.  Eyes: Right eye exhibits no discharge. Left eye exhibits no discharge.  Neck: Neck supple. No thyromegaly present.  Cardiovascular: Normal rate and regular rhythm.   Pulmonary/Chest: Effort normal and breath sounds normal. No respiratory distress. She has no rales.  Abdominal: Soft. Bowel sounds are normal.  Genitourinary: No breast swelling, tenderness, discharge or bleeding. No labial fusion. There is no rash, tenderness, lesion or injury on the right labia. There is no rash, tenderness, lesion or injury on the left labia. Uterus is not deviated, not enlarged, not fixed and not tender. Cervix exhibits no motion tenderness, no discharge and no friability. Right adnexum displays no mass, no tenderness and no fullness. Left adnexum displays no mass, no tenderness and no fullness. No erythema, tenderness or bleeding in the vagina. No foreign body in the vagina. No signs of injury around the vagina. No vaginal discharge found.  Lymphadenopathy:    She has no cervical adenopathy.  Neurological: She is alert and oriented to person, place, and time. No cranial nerve deficit.  Skin: Skin is warm and dry.  Psychiatric: She has a normal mood and affect. Her behavior is normal.     Depression Screen PHQ 2/9 Scores 08/04/2016  PHQ - 2 Score 0   GAD 7 : Generalized Anxiety Score 08/04/2016  Nervous,  Anxious, on Edge 3  Control/stop worrying 1  Worry too much - different things 2  Trouble relaxing 3  Restless 2  Easily annoyed or irritable 3  Afraid - awful might happen 0  Total GAD 7 Score 14  Anxiety Difficulty Extremely difficult       Assessment & Plan:     Routine Health Maintenance and Physical Exam  Exercise Activities and Dietary recommendations Goals    None      Immunization History  Administered Date(s) Administered  . Influenza,inj,Quad PF,36+ Mos 08/24/2015  . Influenza-Unspecified 07/12/2016    Health Maintenance  Topic Date Due  . HIV Screening  02/18/1994  . TETANUS/TDAP  02/18/1998  . PAP SMEAR  08/02/2018  . INFLUENZA VACCINE  Completed     Discussed health benefits of physical activity, and encouraged her to engage in regular exercise appropriate for her  age and condition.      Problem List Items Addressed This Visit      Other   Annual physical exam - Primary   Relevant Orders   CBC with Differential/Platelet   Comprehensive metabolic panel   TSH   Lipid panel    Other Visit Diagnoses    Pap smear for cervical cancer screening       Relevant Orders   Pap IG and HPV (high risk) DNA detection   Iritis of left eye       Encounter for other contraceptive management       Relevant Medications   levonorgestrel-ethinyl estradiol (ORSYTHIA) 0.1-20 MG-MCG tablet   Acute anxiety         Have discussed patient's anxiety being a result of her work situation. Since work situation is not modifiable and does not seem to be changing in the near future, we discussed treatment options including therapy and anti-depressants for long term management of symptoms. Patient wants to think about this and will call back with her decision.   --------------------------------------------------------------------    Trey Sailors, PA-C  [facility]

## 2016-08-08 LAB — PAP IG AND HPV HIGH-RISK
HPV, high-risk: NEGATIVE
PAP Smear Comment: 0

## 2016-08-09 ENCOUNTER — Telehealth: Payer: Self-pay

## 2016-08-09 NOTE — Telephone Encounter (Signed)
Unable to reach patient at this time, voicemail box is not set up yet. Will try contacting patient again at a later time. KW

## 2016-08-09 NOTE — Telephone Encounter (Signed)
-----   Message from Trey SailorsAdriana M Pollak, [place]PA-C sent at 08/08/2016  4:55 PM EST ----- Normal PAP and negative HPV. Please advise, thank you.

## 2016-08-09 NOTE — Telephone Encounter (Signed)
Patient advised as directed below.  Thanks,  -Haroon Shatto 

## 2016-08-10 LAB — TSH: TSH: 1.82 u[IU]/mL (ref 0.450–4.500)

## 2016-08-10 LAB — COMPREHENSIVE METABOLIC PANEL
ALT: 15 IU/L (ref 0–32)
AST: 14 IU/L (ref 0–40)
Albumin/Globulin Ratio: 1.5 (ref 1.2–2.2)
Albumin: 4.1 g/dL (ref 3.5–5.5)
Alkaline Phosphatase: 77 IU/L (ref 39–117)
BUN/Creatinine Ratio: 13 (ref 9–23)
BUN: 11 mg/dL (ref 6–20)
Bilirubin Total: 0.4 mg/dL (ref 0.0–1.2)
CO2: 26 mmol/L (ref 18–29)
Calcium: 9.1 mg/dL (ref 8.7–10.2)
Chloride: 101 mmol/L (ref 96–106)
Creatinine, Ser: 0.84 mg/dL (ref 0.57–1.00)
GFR calc Af Amer: 103 mL/min/{1.73_m2} (ref >59)
GFR calc non Af Amer: 89 mL/min/{1.73_m2} (ref >59)
Globulin, Total: 2.7 g/dL (ref 1.5–4.5)
Glucose: 86 mg/dL (ref 65–99)
Potassium: 4.6 mmol/L (ref 3.5–5.2)
Sodium: 140 mmol/L (ref 134–144)
Total Protein: 6.8 g/dL (ref 6.0–8.5)

## 2016-08-10 LAB — CBC WITH DIFFERENTIAL/PLATELET
Basophils Absolute: 0 10*3/uL (ref 0.0–0.2)
Basos: 0 %
EOS (ABSOLUTE): 0.1 10*3/uL (ref 0.0–0.4)
Eos: 1 %
Hematocrit: 40.5 % (ref 34.0–46.6)
Hemoglobin: 13.5 g/dL (ref 11.1–15.9)
Immature Grans (Abs): 0 10*3/uL (ref 0.0–0.1)
Immature Granulocytes: 0 %
Lymphocytes Absolute: 2.9 10*3/uL (ref 0.7–3.1)
Lymphs: 43 %
MCH: 28.4 pg (ref 26.6–33.0)
MCHC: 33.3 g/dL (ref 31.5–35.7)
MCV: 85 fL (ref 79–97)
Monocytes Absolute: 0.5 10*3/uL (ref 0.1–0.9)
Monocytes: 8 %
Neutrophils Absolute: 3.3 10*3/uL (ref 1.4–7.0)
Neutrophils: 48 %
Platelets: 193 10*3/uL (ref 150–379)
RBC: 4.75 x10E6/uL (ref 3.77–5.28)
RDW: 13 % (ref 12.3–15.4)
WBC: 6.8 10*3/uL (ref 3.4–10.8)

## 2016-08-10 LAB — LIPID PANEL
Chol/HDL Ratio: 4.4 ratio units (ref 0.0–4.4)
Cholesterol, Total: 209 mg/dL — ABNORMAL HIGH (ref 100–199)
HDL: 47 mg/dL (ref >39)
LDL Calculated: 139 mg/dL — ABNORMAL HIGH (ref 0–99)
Triglycerides: 115 mg/dL (ref 0–149)
VLDL Cholesterol Cal: 23 mg/dL (ref 5–40)

## 2016-08-14 ENCOUNTER — Telehealth: Payer: Self-pay

## 2016-08-14 NOTE — Telephone Encounter (Signed)
-----   Message from Trey SailorsAdriana M Pollak, [place]-C sent at 08/14/2016  1:43 PM EST ----- Labs normal, except for lipid panel. Total cholesterol and LDL slightly elevated, but not enough to start medications. First line treatment for this is lifestyle interventions, 150 minute exercise per week, heart health diet. Please advise, thank you.

## 2016-08-14 NOTE — Telephone Encounter (Signed)
Pt is returning call.  UJ#[phone]/FACB#[phone]/MW

## 2016-08-14 NOTE — Telephone Encounter (Signed)
Tried calling; pt VM is not set up.   Thanks,   -Vernona RiegerLaura

## 2016-08-14 NOTE — Telephone Encounter (Signed)
Patient has been advised. KW 

## 2016-08-22 ENCOUNTER — Other Ambulatory Visit: Payer: Self-pay | Admitting: Family Medicine

## 2016-08-22 DIAGNOSIS — Z308 Encounter for other contraceptive management: Secondary | ICD-10-CM

## 2017-06-22 ENCOUNTER — Other Ambulatory Visit: Payer: Self-pay | Admitting: Physician Assistant

## 2017-06-22 DIAGNOSIS — Z308 Encounter for other contraceptive management: Secondary | ICD-10-CM

## 2017-08-07 ENCOUNTER — Encounter: Payer: Self-pay | Admitting: Physician Assistant

## 2017-08-07 ENCOUNTER — Ambulatory Visit (INDEPENDENT_AMBULATORY_CARE_PROVIDER_SITE_OTHER): Payer: BC Managed Care – PPO | Admitting: Physician Assistant

## 2017-08-07 VITALS — BP 122/70 | HR 77 | Resp 16

## 2017-08-07 DIAGNOSIS — Z0000 Encounter for general adult medical examination without abnormal findings: Secondary | ICD-10-CM

## 2017-08-07 DIAGNOSIS — Z23 Encounter for immunization: Secondary | ICD-10-CM | POA: Diagnosis not present

## 2017-08-07 DIAGNOSIS — Z124 Encounter for screening for malignant neoplasm of cervix: Secondary | ICD-10-CM | POA: Diagnosis not present

## 2017-08-07 DIAGNOSIS — Z3041 Encounter for surveillance of contraceptive pills: Secondary | ICD-10-CM | POA: Diagnosis not present

## 2017-08-07 DIAGNOSIS — E7800 Pure hypercholesterolemia, unspecified: Secondary | ICD-10-CM | POA: Diagnosis not present

## 2017-08-07 NOTE — Patient Instructions (Signed)

## 2017-08-07 NOTE — Progress Notes (Signed)
Patient: Megan Fickrika P Bostelman, Female    DOB: 02/07/1981, 36 y.o.   MRN: [id number] Visit Date: 08/08/2017  Today's Provider: Trey SailorsAdriana M Pollak, PA-C   Chief Complaint  Patient presents with  . Annual Exam   Subjective:    Annual physical exam Megan Spencer is a 36 y.o. female who presents today for health maintenance and complete physical. She feels well. She reports exercising not regularly. She reports she is sleeping well.  She does not smoke, use alcohol or drugs.  Working on increasing exercise and modifying diet, has lost 6 pounds.  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  Her son is now 10 and in the fifth grade.   She is with the same, single partner x 8 years. She is currently using birth control, tolerating this well, requesting refills. Yearly PAP + HPV per patient comfort 2/2 history of HPV in 2013.   She would like a flu shot today.  She had some hypercholesterolemia on labs last year, but otherwise her labwork was normal.     Review of Systems  Constitutional: Negative.   HENT: Negative.   Eyes: Negative.   Respiratory: Negative.   Cardiovascular: Negative.   Gastrointestinal: Negative.   Endocrine: Negative.   Genitourinary: Negative.   Musculoskeletal: Negative.   Skin: Negative.   Allergic/Immunologic: Negative.   Neurological: Negative.   Hematological: Negative.   Psychiatric/Behavioral: Negative.     Social History      She  reports that she quit smoking about 15 years ago. she has never used smokeless tobacco. She reports that she does not drink alcohol or use drugs.       Social History   Socioeconomic History  . Marital status: Married    Spouse name: Not on file  . Number of children: Not on file  . Years of education: Not on file  . Highest education level: Not on file  Social Needs  . Financial resource strain: Not on file  . Food insecurity - worry: Not on file  . Food  insecurity - inability: Not on file  . Transportation needs - medical: Not on file  . Transportation needs - non-medical: Not on file  Occupational History  . Not on file  Tobacco Use  . Smoking status: Former Smoker    Last attempt to quit: 09/17/2001    Years since quitting: 15.9  . Smokeless tobacco: Never Used  Substance and Sexual Activity  . Alcohol use: No  . Drug use: No  . Sexual activity: Not on file  Other Topics Concern  . Not on file  Social History Narrative  . Not on file    No past medical history on file.   Patient Active Problem List   Diagnosis Date Noted  . Annual physical exam 08/03/2015  . Allergic rhinitis 07/20/2015  . Abnormal cervical Pap smear with positive HPV DNA test 07/19/2015  . IBS (irritable bowel syndrome) 07/19/2015    Past Surgical History:  Procedure Laterality Date  . CESAREAN SECTION  2008  . KNEE SURGERY Right 1998   arthroscopy    Family History        Family Status  Relation Name Status  . Mother  Alive  . Father  Deceased at age 38       lung cancer  . Sister  Alive  . Mat Aunt  Deceased  . MGM  (  Not Specified)        Her family history includes Cancer in her father; Cervical cancer in her maternal aunt; Fibroids in her mother; Psoriasis in her sister; Ulcerative colitis in her maternal grandmother and mother; Uterine cancer in her maternal aunt.     Allergies  Allergen Reactions  . Codeine   . Neomycin-Bacitracin Zn-Polymyx   . Sulfa Antibiotics   . Tape      Current Outpatient Medications:  .  LARISSIA 0.1-20 MG-MCG tablet, TAKE 1 TABLET BY MOUTH DAILY., Disp: 28 tablet, Rfl: 6 .  prednisoLONE acetate (PRED FORTE) 1 % ophthalmic suspension, , Disp: , Rfl:    Patient Care Team: Maryella Shivers as PCP - General (Physician Assistant)      Objective:   Vitals: BP 122/70 (BP Location: Right Arm, Patient Position: Sitting, Cuff Size: Large)   Pulse 77   Resp 16   LMP 07/25/2017 (Approximate)    SpO2 99%    Vitals:   08/07/17 1516  BP: 122/70  Pulse: 77  Resp: 16  SpO2: 99%     Physical Exam  Constitutional: She is oriented to person, place, and time. She appears well-developed and well-nourished.  HENT:  Right Ear: Tympanic membrane and external ear normal.  Left Ear: Tympanic membrane and external ear normal.  Mouth/Throat: Oropharynx is clear and moist. No oropharyngeal exudate.  Neck: Neck supple.  Cardiovascular: Normal rate and regular rhythm.  Pulmonary/Chest: Effort normal and breath sounds normal. Right breast exhibits no inverted nipple, no mass, no nipple discharge, no skin change and no tenderness. Left breast exhibits no inverted nipple, no mass, no nipple discharge, no skin change and no tenderness. Breasts are symmetrical.  Abdominal: Soft. Bowel sounds are normal.  Genitourinary: Uterus normal. There is no rash, tenderness, lesion or injury on the right labia. There is no rash, tenderness, lesion or injury on the left labia. Cervix exhibits no motion tenderness, no discharge and no friability. Right adnexum displays no mass, no tenderness and no fullness. Left adnexum displays no mass, no tenderness and no fullness. No erythema, tenderness or bleeding in the vagina. No foreign body in the vagina. No signs of injury around the vagina. No vaginal discharge found.  Lymphadenopathy:    She has no cervical adenopathy.  Neurological: She is alert and oriented to person, place, and time.  Skin: Skin is warm and dry.  Psychiatric: She has a normal mood and affect. Her behavior is normal.     Depression Screen PHQ 2/9 Scores 08/07/2017 08/04/2016  PHQ - 2 Score 0 0  PHQ- 9 Score 1 -      Assessment & Plan:     Routine Health Maintenance and Physical Exam  Exercise Activities and Dietary recommendations Goals    None      Immunization History  Administered Date(s) Administered  . Influenza,inj,Quad PF,6+ Mos 08/24/2015, 08/07/2017  .  Influenza-Unspecified 07/12/2016    Health Maintenance  Topic Date Due  . HIV Screening  02/18/1994  . TETANUS/TDAP  02/18/1998  . PAP SMEAR  08/05/2019  . INFLUENZA VACCINE  Completed     Discussed health benefits of physical activity, and encouraged her to engage in regular exercise appropriate for her age and condition.    1. Annual physical exam   2. Cervical cancer screening  - Pap IG and HPV (high risk) DNA detection  3. Hypercholesterolemia  - Lipid Profile  4. Influenza vaccination administered at current visit  - Flu Vaccine QUAD 6+  mos PF IM (Fluarix Quad PF)  5. Encounter for surveillance of contraceptive pills  Refilled x 1 year.  Return in about 1 year (around 08/07/2018).  The entirety of the information documented in the History of Present Illness, Review of Systems and Physical Exam were personally obtained by me. Portions of this information were initially documented by Anson Oregonachelle Presley, CMA and reviewed by me for thoroughness and accuracy.       --------------------------------------------------------------------    Trey SailorsAdriana M Pollak, PA-C  [facility]

## 2017-08-15 LAB — PAP IG AND HPV HIGH-RISK: HPV DNA High Risk: NOT DETECTED

## 2017-12-13 ENCOUNTER — Other Ambulatory Visit: Payer: Self-pay | Admitting: Physician Assistant

## 2017-12-13 DIAGNOSIS — Z308 Encounter for other contraceptive management: Secondary | ICD-10-CM

## 2017-12-15 NOTE — Telephone Encounter (Signed)
Patient states that she will be out of this medication tomorrow and wants to know if you will send in one refill to last until Ricki Rodriguezdriana can approve this.

## 2018-06-23 ENCOUNTER — Other Ambulatory Visit: Payer: Self-pay | Admitting: Family Medicine

## 2018-06-23 DIAGNOSIS — Z308 Encounter for other contraceptive management: Secondary | ICD-10-CM

## 2018-06-25 NOTE — Telephone Encounter (Signed)
Please call to schedule annual physical

## 2018-09-24 ENCOUNTER — Other Ambulatory Visit (HOSPITAL_COMMUNITY)
Admission: RE | Admit: 2018-09-24 | Discharge: 2018-09-24 | Disposition: A | Discharge status: Home / Self Care | Payer: BC Managed Care – PPO | Source: Ambulatory Visit | Attending: Physician Assistant | Admitting: Physician Assistant

## 2018-09-24 ENCOUNTER — Ambulatory Visit (INDEPENDENT_AMBULATORY_CARE_PROVIDER_SITE_OTHER): Payer: BC Managed Care – PPO | Admitting: Physician Assistant

## 2018-09-24 ENCOUNTER — Encounter: Payer: Self-pay | Admitting: Physician Assistant

## 2018-09-24 VITALS — BP 158/88 | HR 78 | Temp 98.7°F | Resp 16 | Ht 64.0 in | Wt 223.0 lb

## 2018-09-24 DIAGNOSIS — Z114 Encounter for screening for human immunodeficiency virus [HIV]: Secondary | ICD-10-CM

## 2018-09-24 DIAGNOSIS — R030 Elevated blood-pressure reading, without diagnosis of hypertension: Secondary | ICD-10-CM

## 2018-09-24 DIAGNOSIS — Z124 Encounter for screening for malignant neoplasm of cervix: Secondary | ICD-10-CM | POA: Diagnosis not present

## 2018-09-24 DIAGNOSIS — Z0000 Encounter for general adult medical examination without abnormal findings: Secondary | ICD-10-CM | POA: Diagnosis not present

## 2018-09-24 DIAGNOSIS — Z13220 Encounter for screening for lipoid disorders: Secondary | ICD-10-CM

## 2018-09-24 DIAGNOSIS — Z308 Encounter for other contraceptive management: Secondary | ICD-10-CM | POA: Diagnosis not present

## 2018-09-24 DIAGNOSIS — H15009 Unspecified scleritis, unspecified eye: Secondary | ICD-10-CM

## 2018-09-24 DIAGNOSIS — Z131 Encounter for screening for diabetes mellitus: Secondary | ICD-10-CM

## 2018-09-24 DIAGNOSIS — Z1329 Encounter for screening for other suspected endocrine disorder: Secondary | ICD-10-CM

## 2018-09-24 DIAGNOSIS — Z130 Encounter for screening for diseases of the blood and blood-forming organs and certain disorders involving the immune mechanism: Secondary | ICD-10-CM

## 2018-09-24 MED ORDER — LEVONORGESTREL-ETHINYL ESTRAD 0.1-20 MG-MCG PO TABS: 1.0000 | ORAL_TABLET | Freq: Every day | ORAL | 2 refills | Status: DC

## 2018-09-24 NOTE — Progress Notes (Signed)
Patient: Megan Spencer, Female    DOB: 05/21/1981, 38 y.o.   MRN: [id number] Visit Date: 09/24/2018  Today's Provider: Trey SailorsAdriana M Pollak, PA-C   Chief Complaint  Patient presents with  . Annual Exam   Subjective:    Annual physical exam Megan Spencer is a 38 y.o. female who presents today for health maintenance and complete physical. She feels well. She reports exercising just started again. She reports she is sleeping well. 08/07/17 CPE 08/07/17 Pap/HPV-negative: She gets year PAPs per comfort level No family history of colon cancer  BP Readings from Last 9 Encounters:  09/24/18 (!) 158/88  08/07/17 122/70  08/04/16 134/84  07/13/16 138/76  07/11/16 138/88  08/03/15 124/60  07/24/15 136/80  07/20/15 120/76  03/30/14 126/86   She is on combination OCP and takes this daily without issues. Does not have history of migraine, MI, stroke, blood clot.  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 She had previously been told that this might be connected to auti-immune disease. She has had knee joint issues but has been otherwise well. Sister with psoriatic arthritis. She does report at one time she had a malar rash and was tested for lupus but tested negative. -----------------------------------------------------------------   Review of Systems  Constitutional: Negative.   HENT: Positive for tinnitus.   Eyes: Negative.   Respiratory: Negative.   Cardiovascular: Negative.   Gastrointestinal: Negative.   Endocrine: Negative.   Genitourinary: Negative.   Musculoskeletal: Negative.   Skin: Negative.   Allergic/Immunologic: Negative.   Neurological: Negative.   Hematological: Negative.   Psychiatric/Behavioral: Negative.     Social History      She  reports that she quit smoking about 17 years ago. She has never used smokeless tobacco. She reports that she does not drink alcohol or use drugs.   Social History   Socioeconomic History  . Marital status: Married    Spouse name: Not on file  . Number of children: Not on file  . Years of education: Not on file  . Highest education level: Not on file  Occupational History  . Not on file  Social Needs  . Financial resource strain: Not on file  . Food insecurity:    Worry: Not on file    Inability: Not on file  . Transportation needs:    Medical: Not on file    Non-medical: Not on file  Tobacco Use  . Smoking status: Former Smoker    Last attempt to quit: 09/17/2001    Years since quitting: 17.0  . Smokeless tobacco: Never Used  Substance and Sexual Activity  . Alcohol use: No  . Drug use: No  . Sexual activity: Not on file  Lifestyle  . Physical activity:    Days per week: Not on file    Minutes per session: Not on file  . Stress: Not on file  Relationships  . Social connections:    Talks on phone: Not on file    Gets together: Not on file    Attends religious service: Not on file    Active member of club or organization: Not on file    Attends meetings of clubs or organizations: Not on file    Relationship status: Not on file  Other Topics Concern  . Not on file  Social History Narrative  . Not on file    No past medical  history on file.   Patient Active Problem List   Diagnosis Date Noted  . Annual physical exam 08/03/2015  . Allergic rhinitis 07/20/2015  . Abnormal cervical Pap smear with positive HPV DNA test 07/19/2015  . IBS (irritable bowel syndrome) 07/19/2015    Past Surgical History:  Procedure Laterality Date  . CESAREAN SECTION  2008  . KNEE SURGERY Right 1998   arthroscopy    Family History        Family Status  Relation Name Status  . Mother  Alive  . Father  Deceased at age 86       lung cancer  . Sister  Alive  . Mat Aunt  Deceased  . MGM  (Not Specified)        Her family history includes Cancer in her father; Cervical cancer in her maternal aunt; Fibroids in her mother;  Psoriasis in her sister; Ulcerative colitis in her maternal grandmother and mother; Uterine cancer in her maternal aunt.      Allergies  Allergen Reactions  . Codeine   . Neomycin-Bacitracin Zn-Polymyx   . Sulfa Antibiotics   . Tape      Current Outpatient Medications:  .  LARISSIA 0.1-20 MG-MCG tablet, TAKE 1 TABLET BY MOUTH EVERY DAY, Disp: 84 tablet, Rfl: 2   Patient Care Team: Maryella Shivers as PCP - General (Physician Assistant)      Objective:   Vitals: BP (!) 158/88 (BP Location: Left Arm, Patient Position: Sitting, Cuff Size: Normal)   Pulse 78   Temp 98.7 F (37.1 C) (Oral)   Resp 16   Ht 5\' 4"  (1.626 m)   Wt 223 lb (101.2 kg)   BMI 38.28 kg/m    Vitals:   09/24/18 1611 09/24/18 1616  BP: (!) 201/98 (!) 158/88  Pulse: 83 78  Resp: 16   Temp: 98.7 F (37.1 C)   TempSrc: Oral   Weight: 223 lb (101.2 kg)   Height: 5\' 4"  (1.626 m)      Physical Exam Exam conducted with a chaperone present.  Constitutional:      Appearance: Normal appearance.  HENT:     Right Ear: Tympanic membrane and ear canal normal.     Left Ear: Tympanic membrane and ear canal normal.     Mouth/Throat:     Mouth: Mucous membranes are moist.     Pharynx: Oropharynx is clear.  Neck:     Musculoskeletal: Neck supple.  Cardiovascular:     Rate and Rhythm: Normal rate and regular rhythm.     Heart sounds: Normal heart sounds.  Pulmonary:     Effort: Pulmonary effort is normal.     Breath sounds: Normal breath sounds.  Chest:     Breasts: Breasts are symmetrical.        Right: Normal.        Left: Normal.  Abdominal:     General: Bowel sounds are normal.     Palpations: Abdomen is soft.  Genitourinary:    Exam position: Lithotomy position.     Pubic Area: No rash.      Labia:        Right: No rash.        Left: No rash.      Vagina: Normal.     Cervix: Normal.     Uterus: Normal.      Adnexa: Right adnexa normal and left adnexa normal.  Skin:    General: Skin  is warm and dry.  Neurological:     Mental Status: She is alert and oriented to person, place, and time. Mental status is at baseline.  Psychiatric:        Mood and Affect: Mood normal.        Behavior: Behavior normal.      Depression Screen PHQ 2/9 Scores 09/24/2018 08/07/2017 08/04/2016  PHQ - 2 Score 0 0 0  PHQ- 9 Score 1 1 -      Assessment & Plan:     Routine Health Maintenance and Physical Exam  Exercise Activities and Dietary recommendations Goals   None     Immunization History  Administered Date(s) Administered  . Influenza,inj,Quad PF,6+ Mos 08/24/2015, 08/07/2017  . Influenza-Unspecified 07/12/2016, 06/20/2018    Health Maintenance  Topic Date Due  . HIV Screening  02/18/1994  . TETANUS/TDAP  02/18/1998  . PAP SMEAR-Modifier  08/07/2020  . INFLUENZA VACCINE  Completed     Discussed health benefits of physical activity, and encouraged her to engage in regular exercise appropriate for her age and condition.    1. Annual physical exam  - Lipid Profile  2. Cervical cancer screening  - Cytology - PAP  3. Encounter for other contraceptive management  - levonorgestrel-ethinyl estradiol (LARISSIA) 0.1-20 MG-MCG tablet; Take 1 tablet by mouth daily.  Dispense: 84 tablet; Refill: 2  4. Elevated BP without diagnosis of hypertension  BP's previously have been normal to borderline. Instructed patient to take her blood pressure at home and call back if consistently >140/90 for OV.  5. 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 She does not have any other current overt symptoms of auto-immune disease. I will leave rheumatology referral up to her discretion.   6. Encounter for screening for HIV  - HIV antibody (with reflex)  7. Thyroid disorder screening  - TSH  8. Screening for deficiency anemia  - CBC with Differential  9. Diabetes mellitus screening  - Comprehensive Metabolic Panel (CMET)  10. Lipid  screening  - Lipid Profile  The entirety of the information documented in the History of Present Illness, Review of Systems and Physical Exam were personally obtained by me. Portions of this information were initially documented by Rondel Baton, CMA and reviewed by me for thoroughness and accuracy.   Return in about 1 year (around 09/25/2019) for CPE.  --------------------------------------------------------------------    Trey Sailors, PA-C  [facility]

## 2018-09-27 ENCOUNTER — Telehealth: Payer: Self-pay

## 2018-09-27 LAB — CYTOLOGY - PAP
Diagnosis: NEGATIVE
HPV: NOT DETECTED

## 2018-09-27 NOTE — Telephone Encounter (Signed)
-----   Message from Trey Sailors, [place] sent at 09/27/2018  9:37 AM EST ----- PAP normal.

## 2018-09-27 NOTE — Telephone Encounter (Signed)
Patient advised.KW 

## 2018-09-28 LAB — TSH: TSH: 2.65 u[IU]/mL (ref 0.450–4.500)

## 2018-09-28 LAB — COMPREHENSIVE METABOLIC PANEL
ALT: 10 IU/L (ref 0–32)
AST: 15 IU/L (ref 0–40)
Albumin/Globulin Ratio: 1.5 (ref 1.2–2.2)
Albumin: 4.3 g/dL (ref 3.5–5.5)
Alkaline Phosphatase: 89 IU/L (ref 39–117)
BUN/Creatinine Ratio: 12 (ref 9–23)
BUN: 11 mg/dL (ref 6–20)
Bilirubin Total: 0.5 mg/dL (ref 0.0–1.2)
CO2: 23 mmol/L (ref 20–29)
Calcium: 9.7 mg/dL (ref 8.7–10.2)
Chloride: 100 mmol/L (ref 96–106)
Creatinine, Ser: 0.92 mg/dL (ref 0.57–1.00)
GFR calc Af Amer: 91 mL/min/{1.73_m2} (ref >59)
GFR calc non Af Amer: 79 mL/min/{1.73_m2} (ref >59)
Globulin, Total: 2.8 g/dL (ref 1.5–4.5)
Glucose: 95 mg/dL (ref 65–99)
Potassium: 4.3 mmol/L (ref 3.5–5.2)
Sodium: 137 mmol/L (ref 134–144)
Total Protein: 7.1 g/dL (ref 6.0–8.5)

## 2018-09-28 LAB — CBC WITH DIFFERENTIAL/PLATELET
Basophils Absolute: 0 10*3/uL (ref 0.0–0.2)
Basos: 0 %
EOS (ABSOLUTE): 0.1 10*3/uL (ref 0.0–0.4)
Eos: 1 %
Hematocrit: 39.9 % (ref 34.0–46.6)
Hemoglobin: 13.4 g/dL (ref 11.1–15.9)
Immature Grans (Abs): 0 10*3/uL (ref 0.0–0.1)
Immature Granulocytes: 0 %
Lymphocytes Absolute: 2.4 10*3/uL (ref 0.7–3.1)
Lymphs: 34 %
MCH: 27.6 pg (ref 26.6–33.0)
MCHC: 33.6 g/dL (ref 31.5–35.7)
MCV: 82 fL (ref 79–97)
Monocytes Absolute: 0.5 10*3/uL (ref 0.1–0.9)
Monocytes: 7 %
Neutrophils Absolute: 4 10*3/uL (ref 1.4–7.0)
Neutrophils: 58 %
Platelets: 213 10*3/uL (ref 150–450)
RBC: 4.85 x10E6/uL (ref 3.77–5.28)
RDW: 12.5 % (ref 11.7–15.4)
WBC: 7 10*3/uL (ref 3.4–10.8)

## 2018-09-28 LAB — LIPID PANEL
Chol/HDL Ratio: 4.4 ratio (ref 0.0–4.4)
Cholesterol, Total: 201 mg/dL — ABNORMAL HIGH (ref 100–199)
HDL: 46 mg/dL (ref >39)
LDL Calculated: 132 mg/dL — ABNORMAL HIGH (ref 0–99)
Triglycerides: 114 mg/dL (ref 0–149)
VLDL Cholesterol Cal: 23 mg/dL (ref 5–40)

## 2018-09-28 LAB — HIV ANTIBODY (ROUTINE TESTING W REFLEX): HIV Screen 4th Generation wRfx: NONREACTIVE

## 2018-10-02 ENCOUNTER — Telehealth: Payer: Self-pay

## 2018-10-02 NOTE — Telephone Encounter (Signed)
Spoke to pt and advised lab results.  dbs

## 2018-10-02 NOTE — Telephone Encounter (Signed)
[id number] 

## 2018-10-02 NOTE — Telephone Encounter (Signed)
-----   Message from Trey Sailors, [place] sent at 09/30/2018  1:13 PM EST ----- Cholesterol just a touch high but actually lower than last year. Remaining labwork is normal.

## 2019-08-22 ENCOUNTER — Other Ambulatory Visit: Payer: Self-pay | Admitting: Physician Assistant

## 2019-08-22 DIAGNOSIS — Z308 Encounter for other contraceptive management: Secondary | ICD-10-CM

## 2019-08-22 NOTE — Telephone Encounter (Signed)
Pt has called requesting refill urgently, she is out tomorrow. Please advise

## 2019-11-16 ENCOUNTER — Ambulatory Visit: Payer: BC Managed Care – PPO | Attending: Internal Medicine

## 2019-11-16 DIAGNOSIS — Z23 Encounter for immunization: Secondary | ICD-10-CM | POA: Insufficient documentation

## 2019-11-16 NOTE — Progress Notes (Signed)
   Covid-19 Vaccination Clinic  Name:  Megan Spencer    MRN: [id number] DOB: 02-03-1981  11/16/2019  [name] was observed post Covid-19 immunization for 15 minutes without incidence. She was provided with Vaccine Information Sheet and instruction to access the V-Safe system.   [name] was instructed to call 911 with any severe reactions post vaccine: Marland Kitchen Difficulty breathing  . Swelling of your face and throat  . A fast heartbeat  . A bad rash all over your body  . Dizziness and weakness    Immunizations Administered    Name Date Dose VIS Date Route   Pfizer COVID-19 Vaccine 11/16/2019  3:02 PM 0.3 mL 08/29/2019 Intramuscular   Manufacturer: [employer], [employer]   Lot: [id number]   NDC: [address]-5

## 2019-12-10 ENCOUNTER — Ambulatory Visit: Payer: BC Managed Care – PPO | Attending: Internal Medicine

## 2019-12-10 DIAGNOSIS — Z23 Encounter for immunization: Secondary | ICD-10-CM

## 2019-12-10 NOTE — Progress Notes (Signed)
   Covid-19 Vaccination Clinic  Name:  Megan Spencer    MRN: [id number] DOB: [birth year]  12/10/2019  [name] was observed post Covid-19 immunization for 15 minutes without incident. She was provided with Vaccine Information Sheet and instruction to access the V-Safe system.   [name] was instructed to call 911 with any severe reactions post vaccine: Marland Kitchen Difficulty breathing  . Swelling of face and throat  . A fast heartbeat  . A bad rash all over body  . Dizziness and weakness   Immunizations Administered    Name Date Dose VIS Date Route   Pfizer COVID-19 Vaccine 12/10/2019  3:57 PM 0.3 mL 08/29/2019 Intramuscular   Manufacturer: [employer], [employer]   Lot: [id number]   NDC: [address]-1

## 2020-01-13 ENCOUNTER — Encounter: Payer: BC Managed Care – PPO | Admitting: Physician Assistant

## 2020-01-21 NOTE — Progress Notes (Signed)
Complete physical exam   Patient: Megan Spencer   DOB: [birth year]   39 y.o. Female  MRN: [id number] Visit Date: 01/22/2020  Today's healthcare provider: Trey Sailors, PA-C   Chief Complaint  Patient presents with  . Annual Exam  I,Wrangler [employer] M Tyteanna Ost,acting as a scribe for Trey Sailors, PA-C.,have documented all relevant documentation on the behalf of Trey Sailors, PA-C,as directed by  Trey Sailors, PA-C while in the presence of Trey Sailors, PA-C.  Subjective    Megan Spencer is a 39 y.o. female who presents today for a complete physical exam.  She reports consuming a general and low sodium diet. Home exercise routine includes treadmill and walk. She generally feels well. She reports sleeping fairly well. She does not have additional problems to discuss today.  HPI  Elevated blood pressure at visit today, 01/22/2020 and prior visit. Patient was advised that the birth control maybe contribute to elevated blood pressure.Patient was advised to return in 6 week to recheck blood pressure.   Contraceptive Patient is currently been switched off of [place] to Camila birth control for HTN.  BP Readings from Last 3 Encounters:  01/22/20 (!) 158/96  09/24/18 (!) 158/88  08/07/17 122/70     No past medical history on file. Past Surgical History:  Procedure Laterality Date  . CESAREAN SECTION  2008  . KNEE SURGERY Right 1998   arthroscopy   Social History   Socioeconomic History  . Marital status: Married    Spouse name: Not on file  . Number of children: Not on file  . Years of education: Not on file  . Highest education level: Not on file  Occupational History  . Not on file  Tobacco Use  . Smoking status: Former Smoker    Quit date: 09/17/2001    Years since quitting: 18.3  . Smokeless tobacco: Never Used  Substance and Sexual Activity  . Alcohol use: No  . Drug use: No  . Sexual activity: Not on file  Other Topics Concern  . Not on file    Social History Narrative  . Not on file   Social Determinants of Health   Financial Resource Strain:   . Difficulty of Paying Living Expenses:   Food Insecurity:   . Worried About Programme researcher, broadcasting/film/video in the Last Year:   . Barista in the Last Year:   Transportation Needs:   . Freight forwarder (Medical):   Marland Kitchen Lack of Transportation (Non-Medical):   Physical Activity:   . Days of Exercise per Week:   . Minutes of Exercise per Session:   Stress:   . Feeling of Stress :   Social Connections:   . Frequency of Communication with Friends and Family:   . Frequency of Social Gatherings with Friends and Family:   . Attends Religious Services:   . Active Member of Clubs or Organizations:   . Attends Banker Meetings:   Marland Kitchen Marital Status:   Intimate Partner Violence:   . Fear of Current or Ex-Partner:   . Emotionally Abused:   Marland Kitchen Physically Abused:   . Sexually Abused:    Family Status  Relation Name Status  . Mother  Alive  . Father  Deceased at age 63       lung cancer  . Sister  Alive  . Mat Aunt  Deceased  . MGM  (Not Specified)   Family History  Problem Relation  Age of Onset  . Fibroids Mother   . Ulcerative colitis Mother   . Cancer Father        lung cancer  . Psoriasis Sister   . Uterine cancer Maternal Aunt   . Cervical cancer Maternal Aunt   . Ulcerative colitis Maternal Grandmother    Allergies  Allergen Reactions  . Codeine   . Neomycin-Bacitracin Zn-Polymyx   . Sulfa Antibiotics   . Tape     Patient Care Team: Maryella Shivers as PCP - General (Physician Assistant)   Medications: Outpatient Medications Prior to Visit  Medication Sig  . LARISSIA 0.1-20 MG-MCG tablet TAKE 1 TABLET BY MOUTH EVERY DAY   No facility-administered medications prior to visit.    Review of Systems  Constitutional: Negative.   HENT: Negative.   Eyes: Negative.   Respiratory: Negative.   Cardiovascular: Negative.   Gastrointestinal:  Negative.   Endocrine: Negative.   Genitourinary: Negative.   Musculoskeletal: Negative.   Skin: Negative.   Allergic/Immunologic: Negative.   Neurological: Negative.   Hematological: Negative.   Psychiatric/Behavioral: Negative.       Objective    BP (!) 158/96 (BP Location: Left Arm, Patient Position: Sitting, Cuff Size: Normal)   Pulse 87   Temp (!) 97.5 F (36.4 C) (Temporal)   Ht 5\' 4"  (1.626 m)   Wt 218 lb 3.2 oz (99 kg)   LMP 01/07/2020 (Exact Date)   SpO2 95%   BMI 37.45 kg/m    Physical Exam Exam conducted with a chaperone present.  Constitutional:      Appearance: Normal appearance.  HENT:     Right Ear: Tympanic membrane, ear canal and external ear normal.     Left Ear: Tympanic membrane, ear canal and external ear normal.  Cardiovascular:     Rate and Rhythm: Normal rate and regular rhythm.     Pulses: Normal pulses.     Heart sounds: Normal heart sounds.  Pulmonary:     Effort: Pulmonary effort is normal.     Breath sounds: Normal breath sounds.  Chest:     Breasts:        Right: Normal.        Left: Normal.  Abdominal:     General: Abdomen is flat. Bowel sounds are normal.     Palpations: Abdomen is soft.  Genitourinary:    Vagina: Normal.     Cervix: Normal.     Uterus: Normal.   Skin:    General: Skin is warm and dry.  Neurological:     General: No focal deficit present.     Mental Status: She is alert and oriented to person, place, and time.  Psychiatric:        Mood and Affect: Mood normal.        Behavior: Behavior normal.       Depression Screen  PHQ 2/9 Scores 01/22/2020 09/24/2018 08/07/2017  PHQ - 2 Score 0 0 0  PHQ- 9 Score 1 1 1     No results found for any visits on 01/22/20.  Assessment & Plan    1. Annual physical exam Patient is UTD on vaccine, pap and will get first mammogram this year. - TSH - Lipid panel - Comprehensive metabolic panel - CBC with Differential/Platelet  2. Cervical cancer screening Patient  prefers to get yearly screening - Cytology - PAP  3. Encounter for other contraceptive management Patient was switched to Camila from due to elevated blood pressure. Recheck in 6  weeks.  - norethindrone (CAMILA) 0.35 MG tablet; Take 1 tablet (0.35 mg total) by mouth daily.  Dispense: 3 Package; Refill: 1  4. Encounter for screening mammogram for malignant neoplasm of breast  - MM 3D SCREEN BREAST BILATERAL; Future  5. Hypertension   Routine Health Maintenance and Physical Exam  Exercise Activities and Dietary recommendations Goals   None     Immunization History  Administered Date(s) Administered  . Influenza,inj,Quad PF,6+ Mos 08/24/2015, 08/07/2017  . Influenza-Unspecified 07/12/2016, 06/20/2018  . PFIZER SARS-COV-2 Vaccination 11/16/2019, 12/10/2019  . Td 11/23/2010  . Tdap 11/23/2010    Health Maintenance  Topic Date Due  . INFLUENZA VACCINE  04/18/2020  . TETANUS/TDAP  11/22/2020  . PAP SMEAR-Modifier  09/24/2021  . COVID-19 Vaccine  Completed  . HIV Screening  Completed    Discussed health benefits of physical activity, and encouraged her to engage in regular exercise appropriate for her age and condition.    Return in about 6 weeks (around 03/04/2020) for Blood pressure.     ITrinna Post, PA-C, have reviewed all documentation for this visit. The documentation on 01/22/20 for the exam, diagnosis, procedures, and orders are all accurate and complete.    Paulene Floor  [facility] [phone] (phone) [phone] (fax)  [place]

## 2020-01-22 ENCOUNTER — Other Ambulatory Visit (HOSPITAL_COMMUNITY)
Admission: RE | Admit: 2020-01-22 | Discharge: 2020-01-22 | Disposition: A | Discharge status: Home / Self Care | Payer: BC Managed Care – PPO | Source: Ambulatory Visit | Attending: Physician Assistant | Admitting: Physician Assistant

## 2020-01-22 ENCOUNTER — Ambulatory Visit (INDEPENDENT_AMBULATORY_CARE_PROVIDER_SITE_OTHER): Payer: BC Managed Care – PPO | Admitting: Physician Assistant

## 2020-01-22 ENCOUNTER — Other Ambulatory Visit: Payer: Self-pay

## 2020-01-22 ENCOUNTER — Encounter: Payer: Self-pay | Admitting: Physician Assistant

## 2020-01-22 VITALS — BP 158/96 | HR 87 | Temp 97.5°F | Ht 64.0 in | Wt 218.2 lb

## 2020-01-22 DIAGNOSIS — Z0000 Encounter for general adult medical examination without abnormal findings: Secondary | ICD-10-CM | POA: Diagnosis not present

## 2020-01-22 DIAGNOSIS — Z124 Encounter for screening for malignant neoplasm of cervix: Secondary | ICD-10-CM | POA: Insufficient documentation

## 2020-01-22 DIAGNOSIS — Z308 Encounter for other contraceptive management: Secondary | ICD-10-CM | POA: Diagnosis not present

## 2020-01-22 DIAGNOSIS — Z1231 Encounter for screening mammogram for malignant neoplasm of breast: Secondary | ICD-10-CM

## 2020-01-22 DIAGNOSIS — I10 Essential (primary) hypertension: Secondary | ICD-10-CM

## 2020-01-22 MED ORDER — NORETHINDRONE 0.35 MG PO TABS: 1.0000 | ORAL_TABLET | Freq: Every day | ORAL | 1 refills | Status: DC

## 2020-01-22 NOTE — Patient Instructions (Signed)
Health Maintenance, Female Adopting a healthy lifestyle and getting preventive care are important in promoting health and wellness. Ask your health care provider about:  The right schedule for you to have regular tests and exams.  Things you can do on your own to prevent diseases and keep yourself healthy. What should I know about diet, weight, and exercise? Eat a healthy diet   Eat a diet that includes plenty of vegetables, fruits, low-fat dairy products, and lean protein.  Do not eat a lot of foods that are high in solid fats, added sugars, or sodium. Maintain a healthy weight Body mass index (BMI) is used to identify weight problems. It estimates body fat based on height and weight. Your health care provider can help determine your BMI and help you achieve or maintain a healthy weight. Get regular exercise Get regular exercise. This is one of the most important things you can do for your health. Most adults should:  Exercise for at least 150 minutes each week. The exercise should increase your heart rate and make you sweat (moderate-intensity exercise).  Do strengthening exercises at least twice a week. This is in addition to the moderate-intensity exercise.  Spend less time sitting. Even light physical activity can be beneficial. Watch cholesterol and blood lipids Have your blood tested for lipids and cholesterol at 41 years of age, then have this test every 5 years. Have your cholesterol levels checked more often if:  Your lipid or cholesterol levels are high.  You are older than 40 years of age.  You are at high risk for heart disease. What should I know about cancer screening? Depending on your health history and family history, you may need to have cancer screening at various ages. This may include screening for:  Breast cancer.  Cervical cancer.  Colorectal cancer.  Skin cancer.  Lung cancer. What should I know about heart disease, diabetes, and high blood  pressure? Blood pressure and heart disease  High blood pressure causes heart disease and increases the risk of stroke. This is more likely to develop in people who have high blood pressure readings, are of African descent, or are overweight.  Have your blood pressure checked: ? Every 3-5 years if you are 18-39 years of age. ? Every year if you are 40 years old or older. Diabetes Have regular diabetes screenings. This checks your fasting blood sugar level. Have the screening done:  Once every three years after age 40 if you are at a normal weight and have a low risk for diabetes.  More often and at a younger age if you are overweight or have a high risk for diabetes. What should I know about preventing infection? Hepatitis B If you have a higher risk for hepatitis B, you should be screened for this virus. Talk with your health care provider to find out if you are at risk for hepatitis B infection. Hepatitis C Testing is recommended for:  Everyone born from 1945 through 1965.  Anyone with known risk factors for hepatitis C. Sexually transmitted infections (STIs)  Get screened for STIs, including gonorrhea and chlamydia, if: ? You are sexually active and are younger than 41 years of age. ? You are older than 41 years of age and your health care provider tells you that you are at risk for this type of infection. ? Your sexual activity has changed since you were last screened, and you are at increased risk for chlamydia or gonorrhea. Ask your health care provider if   you are at risk.  Ask your health care provider about whether you are at high risk for HIV. Your health care provider may recommend a prescription medicine to help prevent HIV infection. If you choose to take medicine to prevent HIV, you should first get tested for HIV. You should then be tested every 3 months for as long as you are taking the medicine. Pregnancy  If you are about to stop having your period (premenopausal) and  you may become pregnant, seek counseling before you get pregnant.  Take 400 to 800 micrograms (mcg) of folic acid every day if you become pregnant.  Ask for birth control (contraception) if you want to prevent pregnancy. Osteoporosis and menopause Osteoporosis is a disease in which the bones lose minerals and strength with aging. This can result in bone fractures. If you are 65 years old or older, or if you are at risk for osteoporosis and fractures, ask your health care provider if you should:  Be screened for bone loss.  Take a calcium or vitamin D supplement to lower your risk of fractures.  Be given hormone replacement therapy (HRT) to treat symptoms of menopause. Follow these instructions at home: Lifestyle  Do not use any products that contain nicotine or tobacco, such as cigarettes, e-cigarettes, and chewing tobacco. If you need help quitting, ask your health care provider.  Do not use street drugs.  Do not share needles.  Ask your health care provider for help if you need support or information about quitting drugs. Alcohol use  Do not drink alcohol if: ? Your health care provider tells you not to drink. ? You are pregnant, may be pregnant, or are planning to become pregnant.  If you drink alcohol: ? Limit how much you use to 0-1 drink a day. ? Limit intake if you are breastfeeding.  Be aware of how much alcohol is in your drink. In the [place] one drink equals one 12 oz bottle of beer (355 mL), one 5 oz glass of wine (148 mL), or one 1 oz glass of hard liquor (44 mL). General instructions  Schedule regular health, dental, and eye exams.  Stay current with your vaccines.  Tell your health care provider if: ? You often feel depressed. ? You have ever been abused or do not feel safe at home. Summary  Adopting a healthy lifestyle and getting preventive care are important in promoting health and wellness.  Follow your health care provider's instructions about healthy  diet, exercising, and getting tested or screened for diseases.  Follow your health care provider's instructions on monitoring your cholesterol and blood pressure. This information is not intended to replace advice given to you by your health care provider. Make sure you discuss any questions you have with your health care provider. Document Revised: 08/28/2018 Document Reviewed: 08/28/2018 Elsevier Patient Education  2020 Elsevier Inc.  

## 2020-01-23 ENCOUNTER — Telehealth: Payer: Self-pay

## 2020-01-23 LAB — LIPID PANEL
Chol/HDL Ratio: 4.4 ratio (ref 0.0–4.4)
Cholesterol, Total: 226 mg/dL — ABNORMAL HIGH (ref 100–199)
HDL: 51 mg/dL (ref >39)
LDL Chol Calc (NIH): 156 mg/dL — ABNORMAL HIGH (ref 0–99)
Triglycerides: 107 mg/dL (ref 0–149)
VLDL Cholesterol Cal: 19 mg/dL (ref 5–40)

## 2020-01-23 LAB — CBC WITH DIFFERENTIAL/PLATELET
Basophils Absolute: 0 10*3/uL (ref 0.0–0.2)
Basos: 0 %
EOS (ABSOLUTE): 0.1 10*3/uL (ref 0.0–0.4)
Eos: 1 %
Hematocrit: 39.6 % (ref 34.0–46.6)
Hemoglobin: 13.4 g/dL (ref 11.1–15.9)
Immature Grans (Abs): 0 10*3/uL (ref 0.0–0.1)
Immature Granulocytes: 0 %
Lymphocytes Absolute: 2.7 10*3/uL (ref 0.7–3.1)
Lymphs: 35 %
MCH: 28.3 pg (ref 26.6–33.0)
MCHC: 33.8 g/dL (ref 31.5–35.7)
MCV: 84 fL (ref 79–97)
Monocytes Absolute: 0.6 10*3/uL (ref 0.1–0.9)
Monocytes: 7 %
Neutrophils Absolute: 4.5 10*3/uL (ref 1.4–7.0)
Neutrophils: 57 %
Platelets: 203 10*3/uL (ref 150–450)
RBC: 4.74 x10E6/uL (ref 3.77–5.28)
RDW: 12.5 % (ref 11.7–15.4)
WBC: 7.9 10*3/uL (ref 3.4–10.8)

## 2020-01-23 LAB — COMPREHENSIVE METABOLIC PANEL
ALT: 11 IU/L (ref 0–32)
AST: 16 IU/L (ref 0–40)
Albumin/Globulin Ratio: 1.4 (ref 1.2–2.2)
Albumin: 4.2 g/dL (ref 3.8–4.8)
Alkaline Phosphatase: 77 IU/L (ref 39–117)
BUN/Creatinine Ratio: 14 (ref 9–23)
BUN: 13 mg/dL (ref 6–24)
Bilirubin Total: 0.4 mg/dL (ref 0.0–1.2)
CO2: 22 mmol/L (ref 20–29)
Calcium: 9.3 mg/dL (ref 8.7–10.2)
Chloride: 102 mmol/L (ref 96–106)
Creatinine, Ser: 0.91 mg/dL (ref 0.57–1.00)
GFR calc Af Amer: 91 mL/min/{1.73_m2} (ref >59)
GFR calc non Af Amer: 79 mL/min/{1.73_m2} (ref >59)
Globulin, Total: 3 g/dL (ref 1.5–4.5)
Glucose: 93 mg/dL (ref 65–99)
Potassium: 3.9 mmol/L (ref 3.5–5.2)
Sodium: 139 mmol/L (ref 134–144)
Total Protein: 7.2 g/dL (ref 6.0–8.5)

## 2020-01-23 LAB — TSH: TSH: 3 u[IU]/mL (ref 0.450–4.500)

## 2020-01-23 NOTE — Telephone Encounter (Signed)
Tried calling; line is ringing busy.  Will try again later.  Thanks,   -Vernona Rieger

## 2020-01-23 NOTE — Telephone Encounter (Signed)
-----   Message from Trey Sailors, [place] sent at 01/23/2020 10:03 AM EDT ----- Cholesterol a little high but not needing treatment yet. This would be helped by some dietary and exercise changes. Remaining labwork normal. We'll see her back to check her BP.

## 2020-01-26 ENCOUNTER — Telehealth: Payer: Self-pay

## 2020-01-26 LAB — CYTOLOGY - PAP
Comment: NEGATIVE
Diagnosis: NEGATIVE
High risk HPV: NEGATIVE

## 2020-01-26 NOTE — Telephone Encounter (Signed)
Patient was advised via another message. 

## 2020-01-26 NOTE — Telephone Encounter (Signed)
Patient was advised of message for pap and lab work.

## 2020-01-26 NOTE — Telephone Encounter (Signed)
-----   Message from Trey Sailors, [place] sent at 01/26/2020  1:44 PM EDT -----   PAP smear is normal and HPV is negative.   Best, Osvaldo Angst, PA-C

## 2020-03-04 ENCOUNTER — Ambulatory Visit: Payer: Self-pay | Admitting: Physician Assistant

## 2020-03-16 ENCOUNTER — Encounter: Payer: Self-pay | Admitting: Physician Assistant

## 2020-03-16 ENCOUNTER — Other Ambulatory Visit: Payer: Self-pay

## 2020-03-16 ENCOUNTER — Ambulatory Visit: Payer: BC Managed Care – PPO | Admitting: Physician Assistant

## 2020-03-16 VITALS — BP 132/83 | HR 85 | Temp 97.9°F | Resp 16 | Ht 64.0 in | Wt 217.0 lb

## 2020-03-16 DIAGNOSIS — I10 Essential (primary) hypertension: Secondary | ICD-10-CM

## 2020-03-16 DIAGNOSIS — Z308 Encounter for other contraceptive management: Secondary | ICD-10-CM

## 2020-03-16 NOTE — Progress Notes (Signed)
Established patient visit   Patient: Megan Spencer   DOB: 09/02/1981   39 y.o. Female  MRN: [id number] Visit Date: 03/16/2020  Today's healthcare provider: Trey Sailors, PA-C   Chief Complaint  Patient presents with  . Hypertension  . Contraception   Subjective    HPI Follow up for contraception  The patient was last seen for this 6 weeks ago. Changes made at last visit include changed from [place] to Camila birth control due to elevated BP.  She reports good compliance with treatment. She feels that condition is Improved. She is not having side effects.   Hypertension, follow-up  BP Readings from Last 3 Encounters:  03/16/20 132/83  01/22/20 (!) 158/96  09/24/18 (!) 158/88   Wt Readings from Last 3 Encounters:  03/16/20 217 lb (98.4 kg)  01/22/20 218 lb 3.2 oz (99 kg)  09/24/18 223 lb (101.2 kg)     She was last seen for hypertension 6 months ago.  BP at that visit was 158/96. Management since that visit includes changing birth control.  She reports good compliance with treatment. She is not having side effects.  She is following a Regular diet. She is not exercising. She does not smoke.   Use of agents associated with hypertension: none.   Outside blood pressures are not being checked. Symptoms: No chest pain No chest pressure  No palpitations No syncope  No dyspnea No orthopnea  No paroxysmal nocturnal dyspnea No lower extremity edema   Pertinent labs: Lab Results  Component Value Date   CHOL 226 (H) 01/22/2020   HDL 51 01/22/2020   LDLCALC 156 (H) 01/22/2020   TRIG 107 01/22/2020   CHOLHDL 4.4 01/22/2020   Lab Results  Component Value Date   NA 139 01/22/2020   K 3.9 01/22/2020   CREATININE 0.91 01/22/2020   GFRNONAA 79 01/22/2020   GFRAA 91 01/22/2020   GLUCOSE 93 01/22/2020     The ASCVD Risk score (Goff DC Jr., et al., 2013) failed to calculate for the following reasons:   Unable to determine if patient is Non-Hispanic  African American      Medications: Outpatient Medications Prior to Visit  Medication Sig  . norethindrone (CAMILA) 0.35 MG tablet Take 1 tablet (0.35 mg total) by mouth daily.  Marland Kitchen LARISSIA 0.1-20 MG-MCG tablet TAKE 1 TABLET BY MOUTH EVERY DAY (Patient not taking: Reported on 03/16/2020)   No facility-administered medications prior to visit.    Review of Systems  Constitutional: Negative.   Cardiovascular: Negative.   Musculoskeletal: Negative.   Neurological: Negative.       Objective    BP 132/83   Pulse 85   Temp 97.9 F (36.6 C)   Resp 16   Ht 5\' 4"  (1.626 m)   Wt 217 lb (98.4 kg)   BMI 37.25 kg/m    Physical Exam Cardiovascular:     Rate and Rhythm: Normal rate and regular rhythm.     Pulses: Normal pulses.     Heart sounds: Normal heart sounds.  Pulmonary:     Effort: Pulmonary effort is normal.     Breath sounds: Normal breath sounds.  Abdominal:     General: Bowel sounds are normal.  Skin:    General: Skin is warm and dry.  Neurological:     Mental Status: She is alert and oriented to person, place, and time. Mental status is at baseline.  Psychiatric:        Mood and Affect:  Mood normal.        Behavior: Behavior normal.      No results found for any visits on 03/16/20.  Assessment & Plan    1. Hypertension, unspecified type Well controlled Continue progesterone only birth control.    2. Encounter for other contraceptive management   Return in about 1 year (around 03/16/2021) for CPE .      ITrey Sailors, PA-C, have reviewed all documentation for this visit. The documentation on 03/17/20 for the exam, diagnosis, procedures, and orders are all accurate and complete.    Maryella Shivers  [facility] [phone] (phone) [phone] (fax)  [facility]

## 2020-04-02 ENCOUNTER — Ambulatory Visit
Admission: RE | Admit: 2020-04-02 | Discharge: 2020-04-02 | Disposition: A | Discharge status: Home / Self Care | Payer: BC Managed Care – PPO | Source: Ambulatory Visit | Attending: Physician Assistant | Admitting: Physician Assistant

## 2020-04-02 DIAGNOSIS — Z1231 Encounter for screening mammogram for malignant neoplasm of breast: Secondary | ICD-10-CM | POA: Insufficient documentation

## 2020-04-05 ENCOUNTER — Other Ambulatory Visit: Payer: Self-pay | Admitting: Physician Assistant

## 2020-04-05 DIAGNOSIS — R928 Other abnormal and inconclusive findings on diagnostic imaging of breast: Secondary | ICD-10-CM

## 2020-04-05 DIAGNOSIS — N6320 Unspecified lump in the left breast, unspecified quadrant: Secondary | ICD-10-CM

## 2020-04-06 ENCOUNTER — Ambulatory Visit: Payer: BC Managed Care – PPO | Admitting: Physician Assistant

## 2020-04-06 ENCOUNTER — Encounter: Payer: Self-pay | Admitting: Physician Assistant

## 2020-04-06 ENCOUNTER — Other Ambulatory Visit: Payer: Self-pay

## 2020-04-06 VITALS — BP 134/82 | HR 80 | Temp 98.7°F | Wt 219.4 lb

## 2020-04-06 DIAGNOSIS — R21 Rash and other nonspecific skin eruption: Secondary | ICD-10-CM

## 2020-04-06 DIAGNOSIS — Z308 Encounter for other contraceptive management: Secondary | ICD-10-CM

## 2020-04-06 DIAGNOSIS — R591 Generalized enlarged lymph nodes: Secondary | ICD-10-CM

## 2020-04-06 MED ORDER — PREDNISONE 10 MG PO TABS
ORAL_TABLET | ORAL | 0 refills | Status: DC
Start: 2020-04-06 — End: 2020-04-29

## 2020-04-06 MED ORDER — NORETHINDRONE 0.35 MG PO TABS: 1.0000 | ORAL_TABLET | Freq: Every day | ORAL | 3 refills | Status: DC

## 2020-04-06 NOTE — Progress Notes (Signed)
     Established patient visit   Patient: Megan Spencer   DOB: 1981-04-18   39 y.o. Female  MRN: [id number] Visit Date: 04/06/2020  Today's healthcare provider: Trey Sailors, PA-C   Chief Complaint  Patient presents with  . Rash  I,Quinta Eimer M Dannon Nguyenthi,acting as a scribe for [employer], PA-C.,have documented all relevant documentation on the behalf of Trey Sailors, PA-C,as directed by  Trey Sailors, PA-C while in the presence of Trey Sailors, PA-C.  Subjective    Rash This is a new problem. The current episode started 1 to 4 weeks ago. The problem is unchanged. The affected locations include the left axilla and right axilla. The rash is characterized by dryness, itchiness and redness. Associated with: wipe used to wipe off of detergent for mammogram. Pertinent negatives include no congestion, cough, fatigue, shortness of breath or sore throat. Treatments tried: hydrocortisone cream. The treatment provided no relief.       Medications: Outpatient Medications Prior to Visit  Medication Sig  . LARISSIA 0.1-20 MG-MCG tablet TAKE 1 TABLET BY MOUTH EVERY DAY (Patient not taking: Reported on 03/16/2020)  . [DISCONTINUED] norethindrone (CAMILA) 0.35 MG tablet Take 1 tablet (0.35 mg total) by mouth daily.   No facility-administered medications prior to visit.    Review of Systems  Constitutional: Negative for fatigue.  HENT: Negative for congestion and sore throat.   Respiratory: Negative for cough and shortness of breath.   Skin: Positive for rash.      Objective    BP 134/82 (BP Location: Left Arm, Patient Position: Sitting, Cuff Size: Normal)   Pulse 80   Temp 98.7 F (37.1 C) (Temporal)   Wt 219 lb 6.4 oz (99.5 kg)   LMP 03/23/2020   SpO2 96%   BMI 37.66 kg/m    Physical Exam Constitutional:      Appearance: Normal appearance.  Skin:    General: Skin is warm and dry.     Findings: Rash present.     Comments: Erythematous rash under bilateral  axillae   Neurological:     Mental Status: She is alert and oriented to person, place, and time. Mental status is at baseline.  Psychiatric:        Mood and Affect: Mood normal.        Behavior: Behavior normal.       No results found for any visits on 04/06/20.  Assessment & Plan    1. Rash Patient has rash under bilateral arms for about 1 week now. Patient was prescribed prednisone 10 mg taper for contact dermatitis.   2. Lymphadenopathy  - US BREAST LTD UNI RIGHT INC AXILLA  3. Encounter for other contraceptive management  - norethindrone (CAMILA) 0.35 MG tablet; Take 1 tablet (0.35 mg total) by mouth daily.  Dispense: 84 tablet; Refill: 3    Return if symptoms worsen or fail to improve.      ITrey Sailors, PA-C, have reviewed all documentation for this visit. The documentation on 04/07/20 for the exam, diagnosis, procedures, and orders are all accurate and complete.    Maryella Shivers  [facility] [phone] (phone) [phone] (fax)  [facility]

## 2020-04-06 NOTE — Patient Instructions (Signed)
Rash, Adult A rash is a change in the color of your skin. A rash can also change the way your skin feels. There are many different conditions and factors that can cause a rash. Some rashes may disappear after a few days, but some may last for a few weeks. Common causes of rashes include:  Viral infections, such as: ? Colds. ? Measles. ? Hand, foot, and mouth disease.  Bacterial infections, such as: ? Scarlet fever. ? Impetigo.  Fungal infections, such as Candida.  Allergic reactions to food, medicines, or skin care products. Follow these instructions at home: The goal of treatment is to stop the itching and keep the rash from spreading. Pay attention to any changes in your symptoms. Follow these instructions to help with your condition: Medicine Take or apply over-the-counter and prescription medicines only as told by your health care provider. These may include:  Corticosteroid creams to treat red or swollen skin.  Anti-itch lotions.  Oral allergy medicines (antihistamines).  Oral corticosteroids for severe symptoms.  Skin care  Apply cool compresses to the affected areas.  Do not scratch or rub your skin.  Avoid covering the rash. Make sure the rash is exposed to air as much as possible. Managing itching and discomfort  Avoid hot showers or baths, which can make itching worse. A cold shower may help.  Try taking a bath with: ? Epsom salts. Follow manufacturer instructions on the packaging. You can get these at your local pharmacy or grocery store. ? Baking soda. Pour a small amount into the bath as told by your health care provider. ? Colloidal oatmeal. Follow manufacturer instructions on the packaging. You can get this at your local pharmacy or grocery store.  Try applying baking soda paste to your skin. Stir water into baking soda until it reaches a paste-like consistency.  Try applying calamine lotion. This is an over-the-counter lotion that helps to relieve  itchiness.  Keep cool and out of the sun. Sweating and being hot can make itching worse. General instructions   Rest as needed.  Drink enough fluid to keep your urine pale yellow.  Wear loose-fitting clothing.  Avoid scented soaps, detergents, and perfumes. Use gentle soaps, detergents, perfumes, and other cosmetic products.  Avoid any substance that causes your rash. Keep a journal to help track what causes your rash. Write down: ? What you eat. ? What cosmetic products you use. ? What you drink. ? What you wear. This includes jewelry.  Keep all follow-up visits as told by your health care provider. This is important. Contact a health care provider if:  You sweat at night.  You lose weight.  You urinate more than normal.  You urinate less than normal, or you notice that your urine is a darker color than usual.  You feel weak.  You vomit.  Your skin or the whites of your eyes look yellow (jaundice).  Your skin: ? Tingles. ? Is numb.  Your rash: ? Does not go away after several days. ? Gets worse.  You are: ? Unusually thirsty. ? More tired than normal.  You have: ? New symptoms. ? Pain in your abdomen. ? A fever. ? Diarrhea. Get help right away if you:  Have a fever and your symptoms suddenly get worse.  Develop confusion.  Have a severe headache or a stiff neck.  Have severe joint pains or stiffness.  Have a seizure.  Develop a rash that covers all or most of your body. The rash may   or may not be painful.  Develop blisters that: ? Are on top of the rash. ? Grow larger or grow together. ? Are painful. ? Are inside your nose or mouth.  Develop a rash that: ? Looks like purple pinprick-sized spots all over your body. ? Has a "bull's eye" or looks like a target. ? Is not related to sun exposure, is red and painful, and causes your skin to peel. Summary  A rash is a change in the color of your skin. Some rashes disappear after a few days,  but some may last for a few weeks.  The goal of treatment is to stop the itching and keep the rash from spreading.  Take or apply over-the-counter and prescription medicines only as told by your health care provider.  Contact a health care provider if you have new or worsening symptoms.  Keep all follow-up visits as told by your health care provider. This is important. This information is not intended to replace advice given to you by your health care provider. Make sure you discuss any questions you have with your health care provider. Document Revised: 12/27/2018 Document Reviewed: 04/08/2018 Elsevier Patient Education  2020 [employer]  

## 2020-04-19 ENCOUNTER — Ambulatory Visit
Admission: RE | Admit: 2020-04-19 | Discharge: 2020-04-19 | Disposition: A | Discharge status: Home / Self Care | Payer: BC Managed Care – PPO | Source: Ambulatory Visit | Attending: Physician Assistant | Admitting: Physician Assistant

## 2020-04-19 ENCOUNTER — Other Ambulatory Visit: Payer: Self-pay | Admitting: Physician Assistant

## 2020-04-19 ENCOUNTER — Other Ambulatory Visit: Payer: Self-pay

## 2020-04-19 DIAGNOSIS — N6320 Unspecified lump in the left breast, unspecified quadrant: Secondary | ICD-10-CM | POA: Diagnosis present

## 2020-04-19 DIAGNOSIS — R928 Other abnormal and inconclusive findings on diagnostic imaging of breast: Secondary | ICD-10-CM

## 2020-04-19 DIAGNOSIS — R591 Generalized enlarged lymph nodes: Secondary | ICD-10-CM | POA: Insufficient documentation

## 2020-04-20 ENCOUNTER — Other Ambulatory Visit: Payer: Self-pay | Admitting: Physician Assistant

## 2020-04-20 DIAGNOSIS — R928 Other abnormal and inconclusive findings on diagnostic imaging of breast: Secondary | ICD-10-CM

## 2020-04-26 ENCOUNTER — Ambulatory Visit
Admission: RE | Admit: 2020-04-26 | Discharge: 2020-04-26 | Disposition: A | Discharge status: Home / Self Care | Payer: BC Managed Care – PPO | Source: Ambulatory Visit | Attending: Physician Assistant | Admitting: Physician Assistant

## 2020-04-26 ENCOUNTER — Other Ambulatory Visit: Payer: Self-pay

## 2020-04-26 ENCOUNTER — Encounter: Payer: Self-pay | Admitting: Body Imaging

## 2020-04-26 DIAGNOSIS — R928 Other abnormal and inconclusive findings on diagnostic imaging of breast: Secondary | ICD-10-CM

## 2020-04-26 DIAGNOSIS — N6320 Unspecified lump in the left breast, unspecified quadrant: Secondary | ICD-10-CM

## 2020-04-26 HISTORY — PX: BREAST BIOPSY: SHX20

## 2020-04-28 LAB — SURGICAL PATHOLOGY

## 2020-04-29 ENCOUNTER — Other Ambulatory Visit: Payer: Self-pay

## 2020-04-29 ENCOUNTER — Ambulatory Visit: Payer: BC Managed Care – PPO | Admitting: Physician Assistant

## 2020-04-29 ENCOUNTER — Encounter: Payer: Self-pay | Admitting: Physician Assistant

## 2020-04-29 VITALS — BP 132/86 | HR 86 | Temp 98.6°F | Wt 217.4 lb

## 2020-04-29 DIAGNOSIS — Z8669 Personal history of other diseases of the nervous system and sense organs: Secondary | ICD-10-CM

## 2020-04-29 DIAGNOSIS — M25561 Pain in right knee: Secondary | ICD-10-CM | POA: Diagnosis not present

## 2020-04-29 DIAGNOSIS — R591 Generalized enlarged lymph nodes: Secondary | ICD-10-CM

## 2020-04-29 DIAGNOSIS — M25562 Pain in left knee: Secondary | ICD-10-CM | POA: Diagnosis not present

## 2020-04-29 NOTE — Patient Instructions (Signed)

## 2020-04-29 NOTE — Progress Notes (Signed)
Established patient visit   Patient: Megan Spencer   DOB: [birth year]   39 y.o. Female  MRN: [id number] Visit Date: 04/29/2020  Today's healthcare provider: Trinna Post, PA-C   Chief Complaint  Patient presents with  . Follow-up  I,Porsha C McClurkin,acting as a scribe for Trinna Post, PA-C.,have documented all relevant documentation on the behalf of Trinna Post, PA-C,as directed by  Trinna Post, PA-C while in the presence of Trinna Post, PA-C.  Subjective    HPI   Follow-up Patient presents today for follow-up visit from having biopsy of the left and right breast. She has suspicious mass and lymph nodes during routing mammography and biopsies were taken from these locations. Patient reports that the doctor advised her it wasn't cancerous. Pathology showed reactive lymph nodes with a broad differential, including auto-immune disease.   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 She has had two episodes of this since then and has been treated with optical steroids.   She does have eczema on her hands in the winter. She has never been diagnosed with psoriasis.   Sister with rheumatoid arthritis and psoriasis and is on enbril. Mom no history of RA. Dad died of lung cancer but did not have rheumatologic conditions.   Patient reports joint pain since she was a child. She had bloodwork done as a child that was unrevealing. She has bilateral knee pain and was treated for patellofemoral syndrome. She had bilateral knee arthroscopy which revealed excess fat on joint. Denies pain in her hands, fingers and wrists. She will have some swelling on her fingers when it is warm but no swelling in the morning. Occasionally has shoulder joint pain.   No fevers, no chills.      Medications: Outpatient Medications Prior to Visit  Medication Sig  . norethindrone (CAMILA) 0.35 MG tablet Take 1 tablet (0.35 mg total) by  mouth daily.  . [DISCONTINUED] LARISSIA 0.1-20 MG-MCG tablet TAKE 1 TABLET BY MOUTH EVERY DAY (Patient not taking: Reported on 03/16/2020)  . [DISCONTINUED] predniSONE (DELTASONE) 10 MG tablet Take taper dose 6 on day 1, 5 on day 2, 4 on day 3, etc...   No facility-administered medications prior to visit.    Review of Systems  Constitutional: Negative.   Respiratory: Negative.   Cardiovascular: Negative.   Hematological: Negative.       Objective    BP 132/86 (BP Location: Right Wrist, Patient Position: Sitting, Cuff Size: Normal)   Pulse 86   Temp 98.6 F (37 C) (Oral)   Wt 217 lb 6.4 oz (98.6 kg)   LMP 04/20/2020 (Exact Date)   SpO2 97%   BMI 37.32 kg/m    Physical Exam Constitutional:      Appearance: Normal appearance. She is obese.  Cardiovascular:     Rate and Rhythm: Normal rate and regular rhythm.     Heart sounds: Normal heart sounds.  Pulmonary:     Effort: Pulmonary effort is normal.     Breath sounds: Normal breath sounds.  Musculoskeletal:        General: No swelling, tenderness or deformity.  Skin:    General: Skin is warm and dry.  Neurological:     General: No focal deficit present.     Mental Status: She is alert and oriented to person, place, and time.  Psychiatric:        Mood and Affect: Mood normal.  Behavior: Behavior normal.       No results found for any visits on 04/29/20.  Assessment & Plan    1. Lymphadenopathy  - ANA w/Reflex if Positive - Rheumatoid Factor - Sed Rate (ESR) - C-reactive protein - Sjogren's syndrome antibods(ssa + ssb) - Ambulatory referral to Rheumatology  2. Arthralgia of both knees  - ANA w/Reflex if Positive - Rheumatoid Factor - Sed Rate (ESR) - C-reactive protein - Sjogren's syndrome antibods(ssa + ssb) - Ambulatory referral to Rheumatology  3. History of iritis  - ANA w/Reflex if Positive - Rheumatoid Factor - Sed Rate (ESR) - C-reactive protein - Sjogren's syndrome antibods(ssa +  ssb) - Ambulatory referral to Rheumatology    No follow-ups on file.      ITrinna Post, PA-C, have reviewed all documentation for this visit. The documentation on 05/05/20 for the exam, diagnosis, procedures, and orders are all accurate and complete.  The entirety of the information documented in the History of Present Illness, Review of Systems and Physical Exam were personally obtained by me. Portions of this information were initially documented by Wilburt Finlay, CMA  I spent 30 minutes dedicated to the care of this patient on the date of this encounter to include pre-visit review of records, face-to-face time with the patient discussing lymphadenopathy, autoimmune ddx and post visit ordering of testing.  and reviewed by me for thoroughness and accuracy.        Paulene Floor  [facility] [phone] (phone) [phone] (fax)  [place]

## 2020-04-30 LAB — SJOGREN'S SYNDROME ANTIBODS(SSA + SSB)
ENA SSA (RO) Ab: <0.2 AI (ref 0.0–0.9)
ENA SSB (LA) Ab: <0.2 AI (ref 0.0–0.9)

## 2020-04-30 LAB — C-REACTIVE PROTEIN: CRP: 14 mg/L — ABNORMAL HIGH (ref 0–10)

## 2020-04-30 LAB — RHEUMATOID FACTOR: Rheumatoid fact SerPl-aCnc: <10 IU/mL (ref 0.0–13.9)

## 2020-04-30 LAB — SEDIMENTATION RATE: Sed Rate: 52 mm/hr — ABNORMAL HIGH (ref 0–32)

## 2020-04-30 LAB — ANA W/REFLEX IF POSITIVE: Anti Nuclear Antibody (ANA): NEGATIVE

## 2020-08-19 IMAGING — MG MM BREAST LOCALIZATION CLIP
4 series · 4 of 12 positions shown · non-contrast
Comparison: Previous exam(s).

CLINICAL DATA: Post biopsy mammogram of the bilateral breasts clip
placement.

EXAM:
DIAGNOSTIC BILATERAL MAMMOGRAM POST ULTRASOUND BIOPSY

[L CC synth-2D]
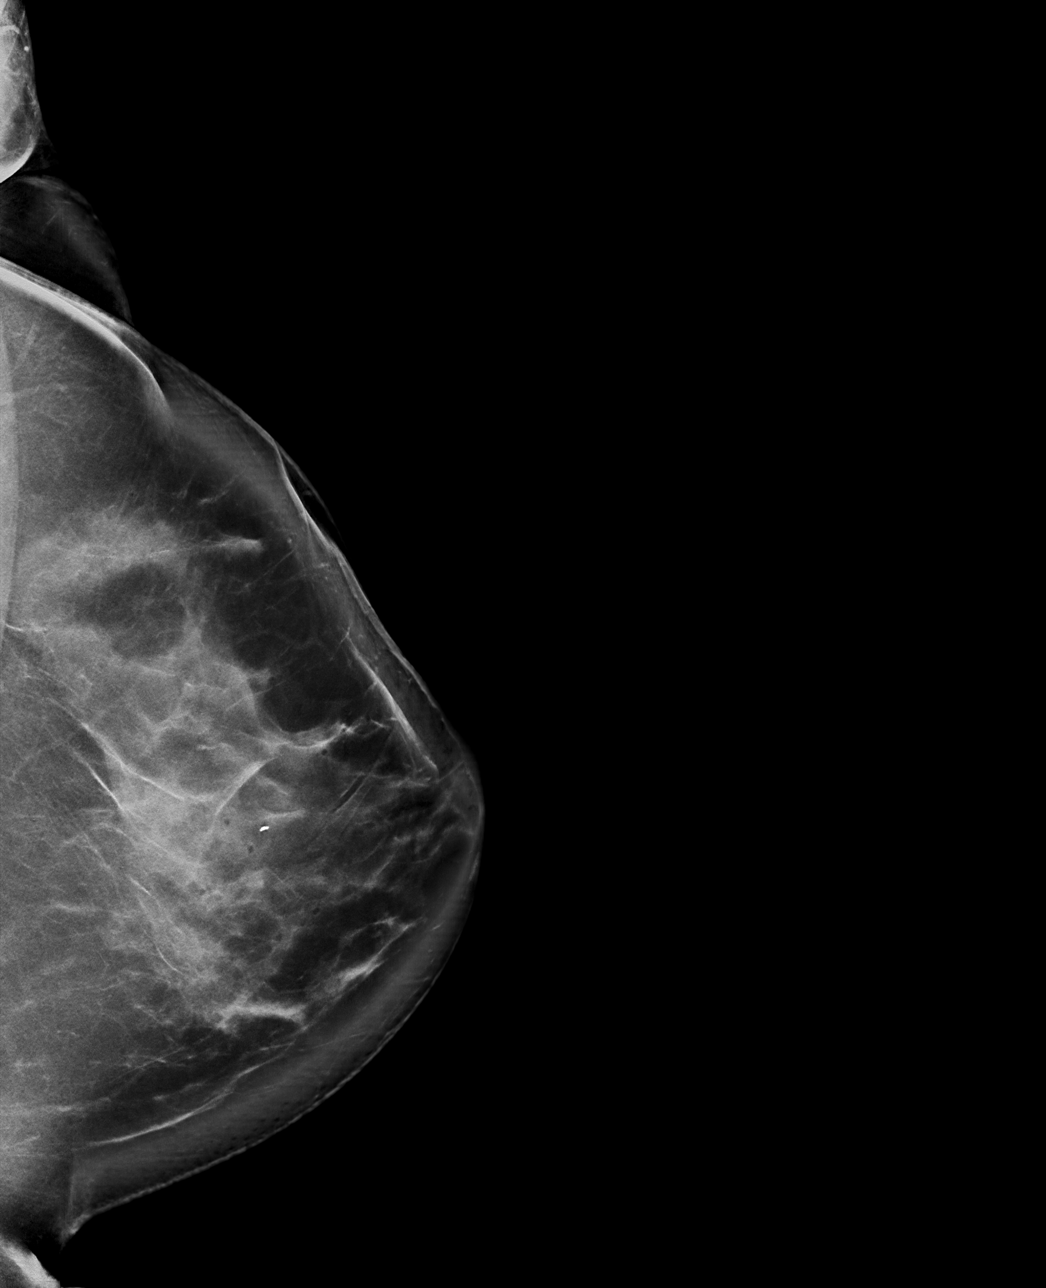

[L ML synth-2D]
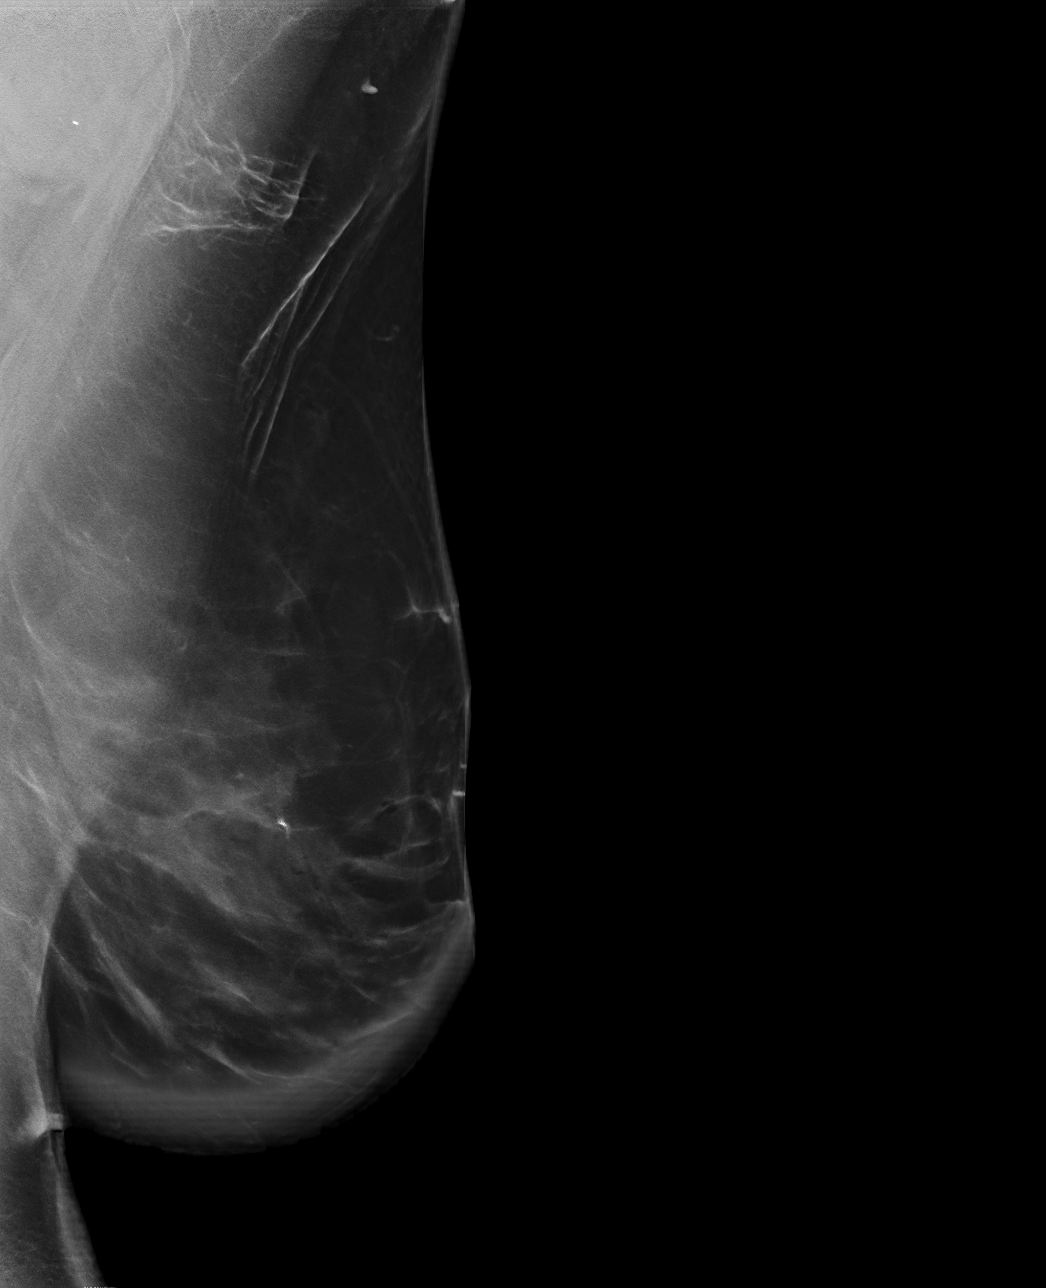

[L CC tomo · tomo slice 55/110.0]
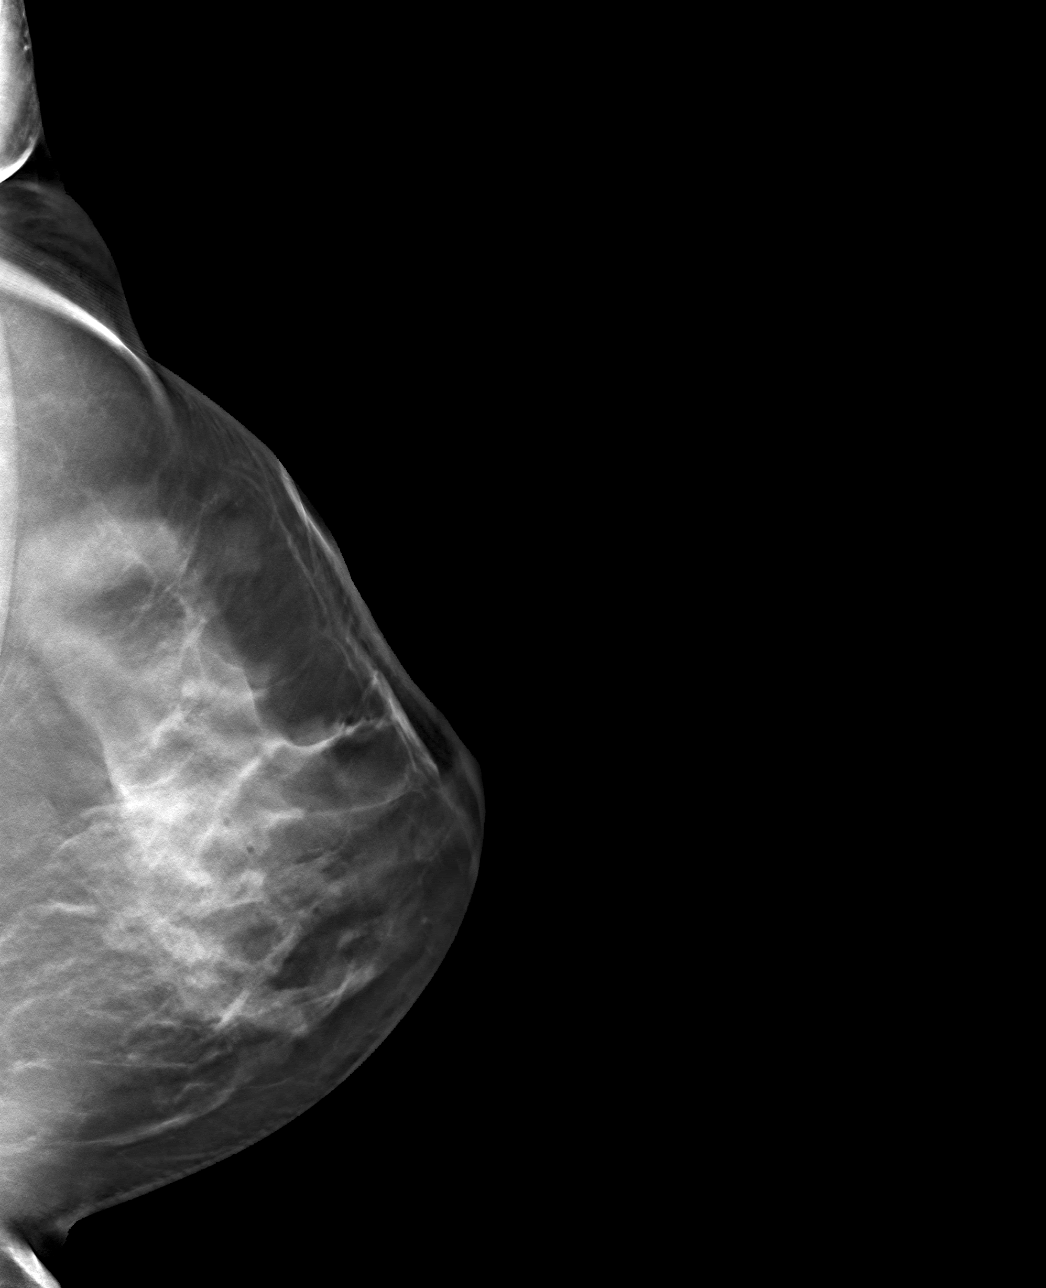

[L ML tomo · tomo slice 65/129.0]
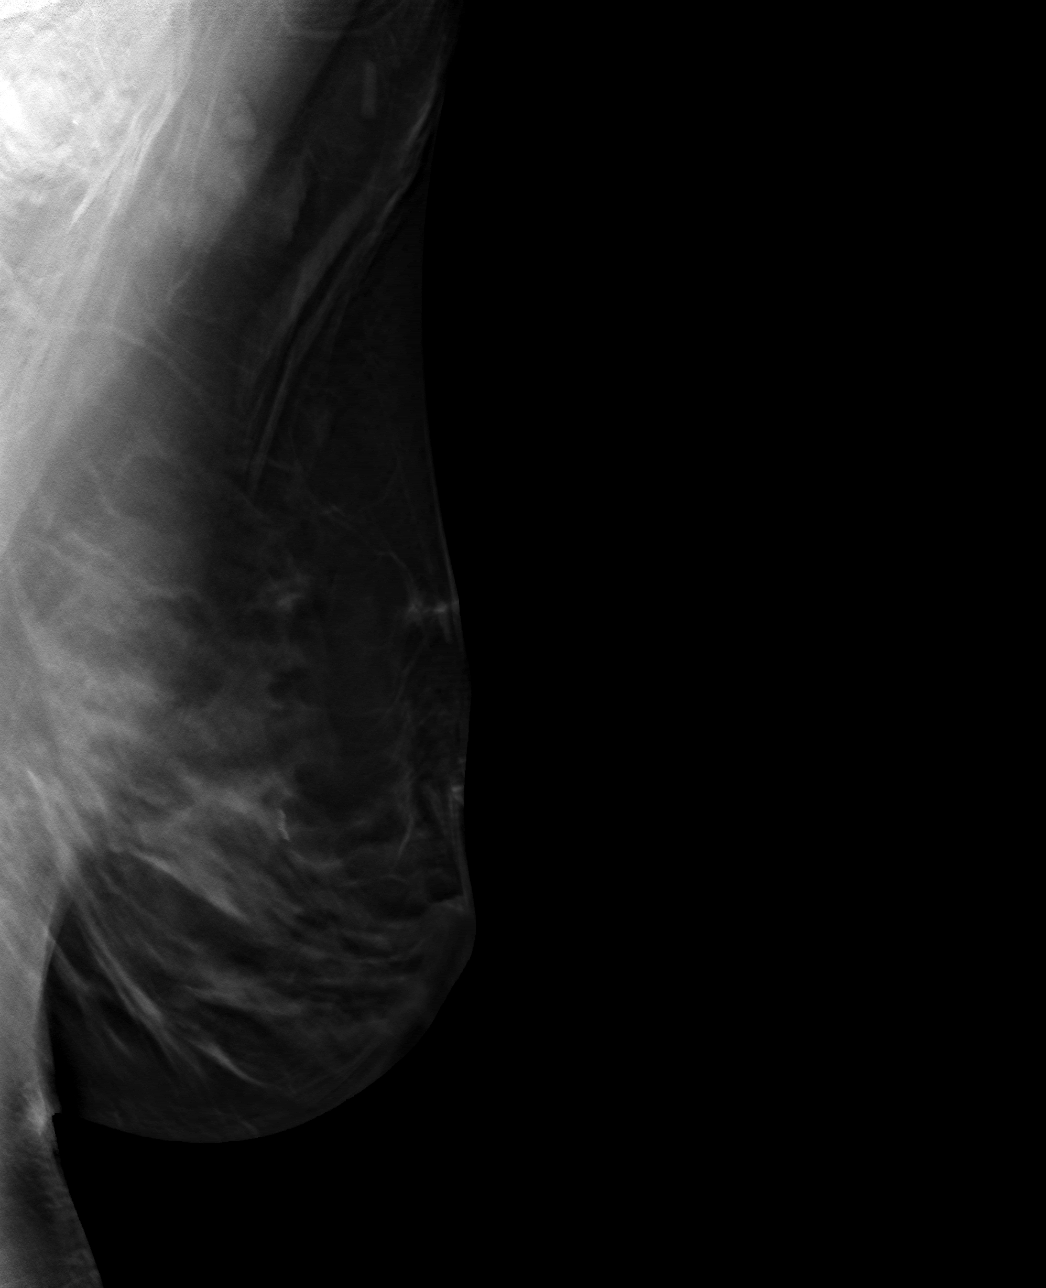

[4 of 12 positions shown; findings below may reference images not displayed]

FINDINGS: Mammographic images were obtained following ultrasound guided biopsy
of a left breast mass and bilateral axillary lymph nodes. The biopsy
marking clips are in expected position at the sites of biopsy.
IMPRESSION: 1. Appropriate positioning of the HydroMARK shape 4 biopsy marking
clip at the site of biopsy in the superior left breast.

2. Appropriate positioning of the HydroMARK shape 3 biopsy marking
clip at the site of biopsy in the left axilla.

3. Appropriate positioning of the HydroMARK shape 3 biopsy marking
clip at the site of biopsy in the right axilla.

Final Assessment: Post Procedure Mammograms for Marker Placement

## 2020-08-19 IMAGING — MG MM BREAST LOCALIZATION CLIP
2 series · 3 of 6 positions shown · non-contrast
Comparison: Previous exam(s).

CLINICAL DATA: Post biopsy mammogram of the bilateral breasts clip
placement.

EXAM:
DIAGNOSTIC BILATERAL MAMMOGRAM POST ULTRASOUND BIOPSY

[R ML synth-2D]
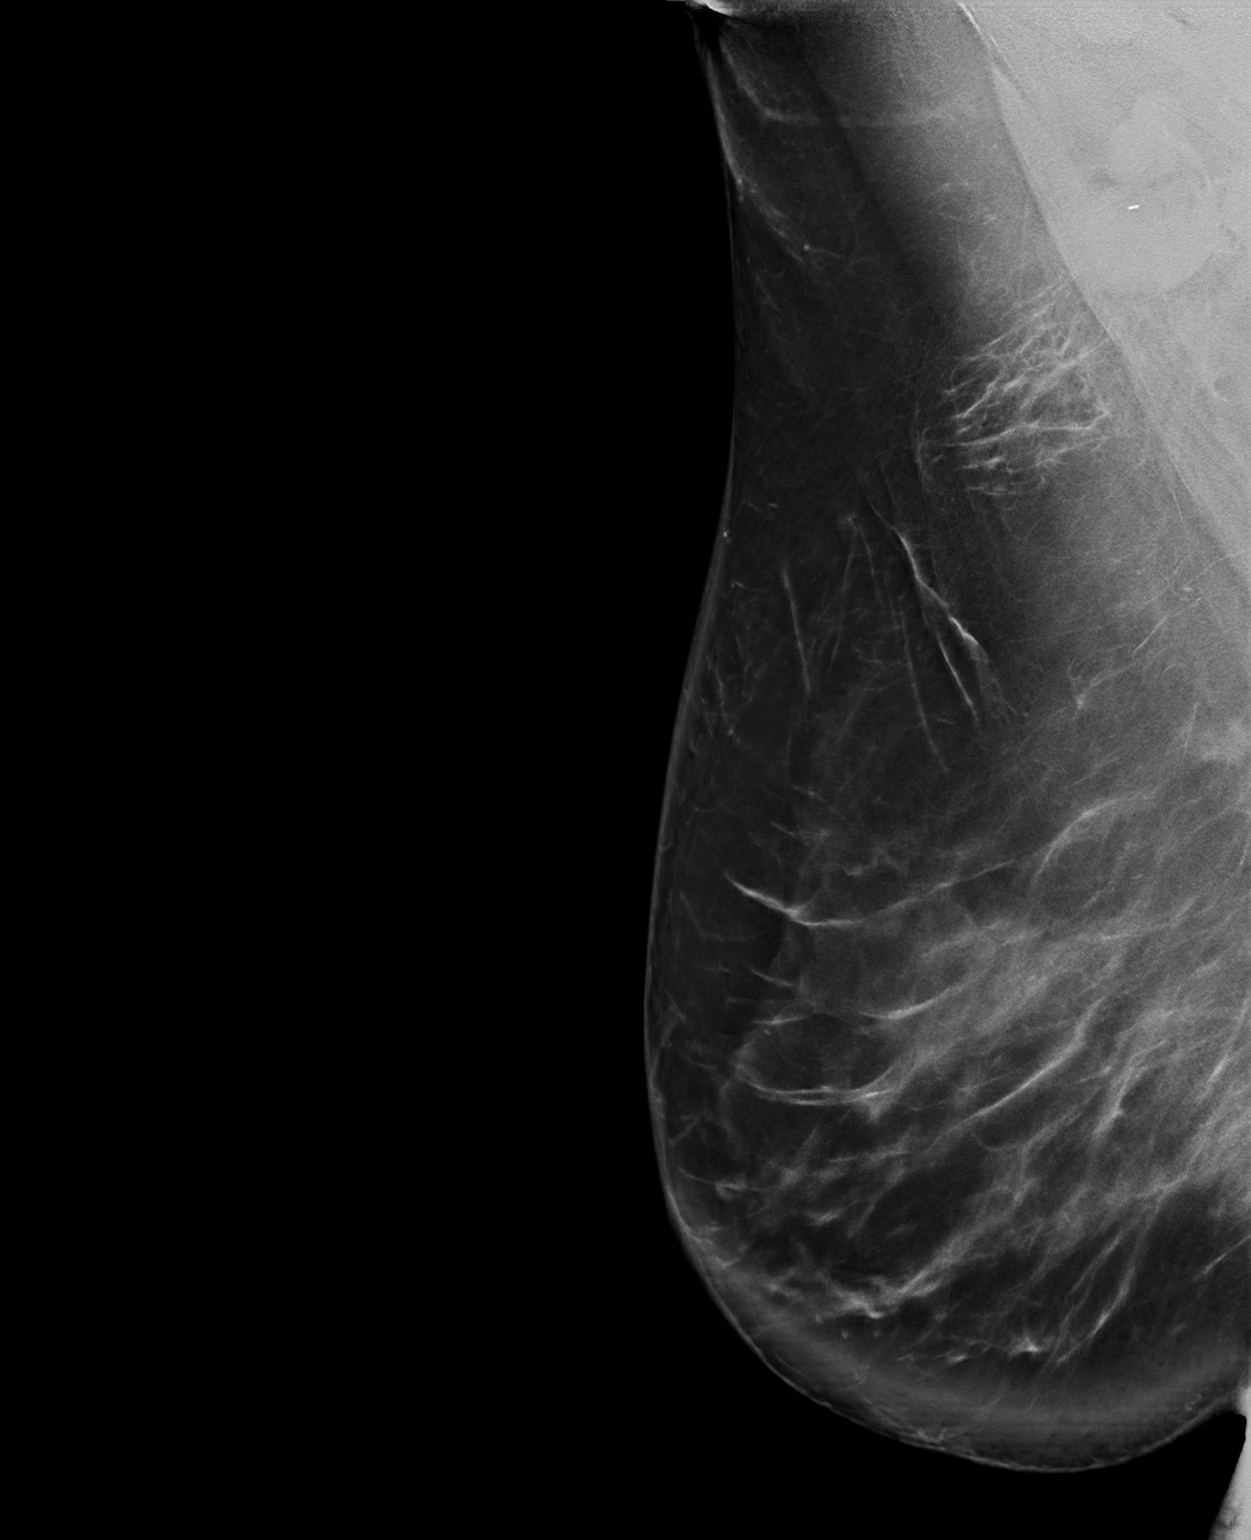

[R ML tomo · 2 of 113 frames shown]
[frame 37/113]
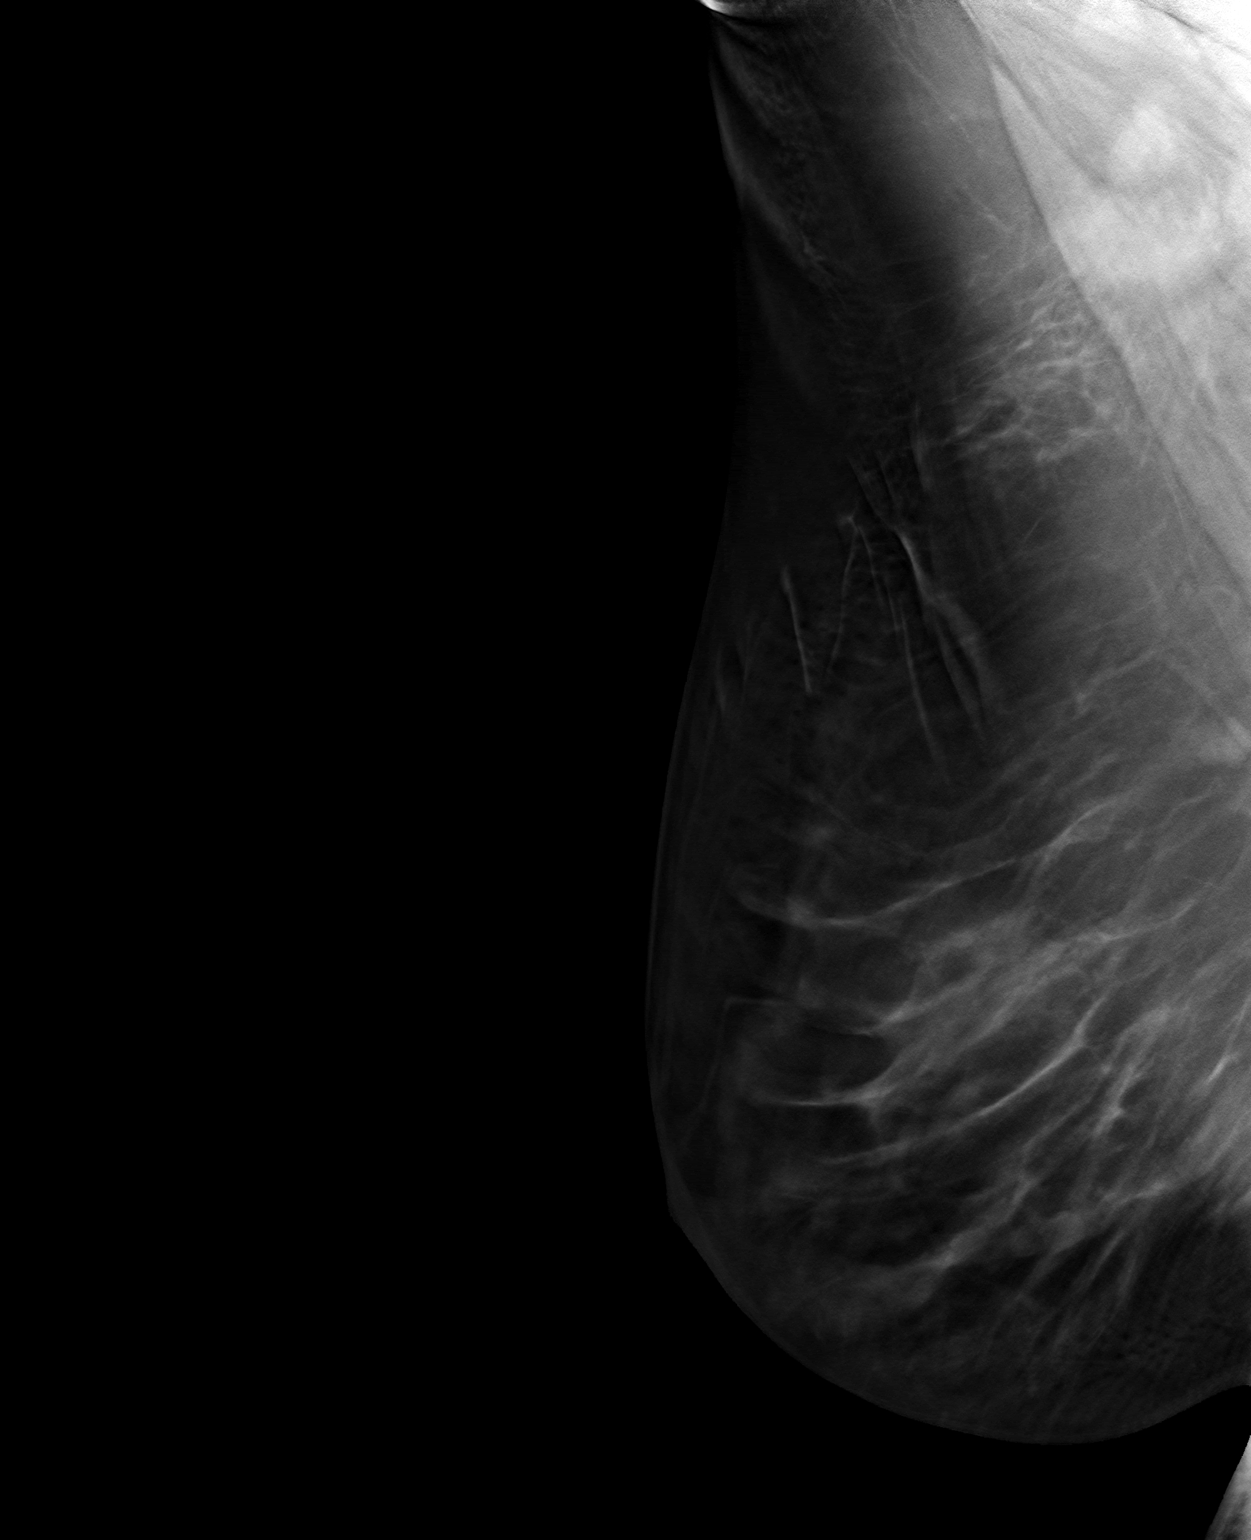
[frame 57/113]
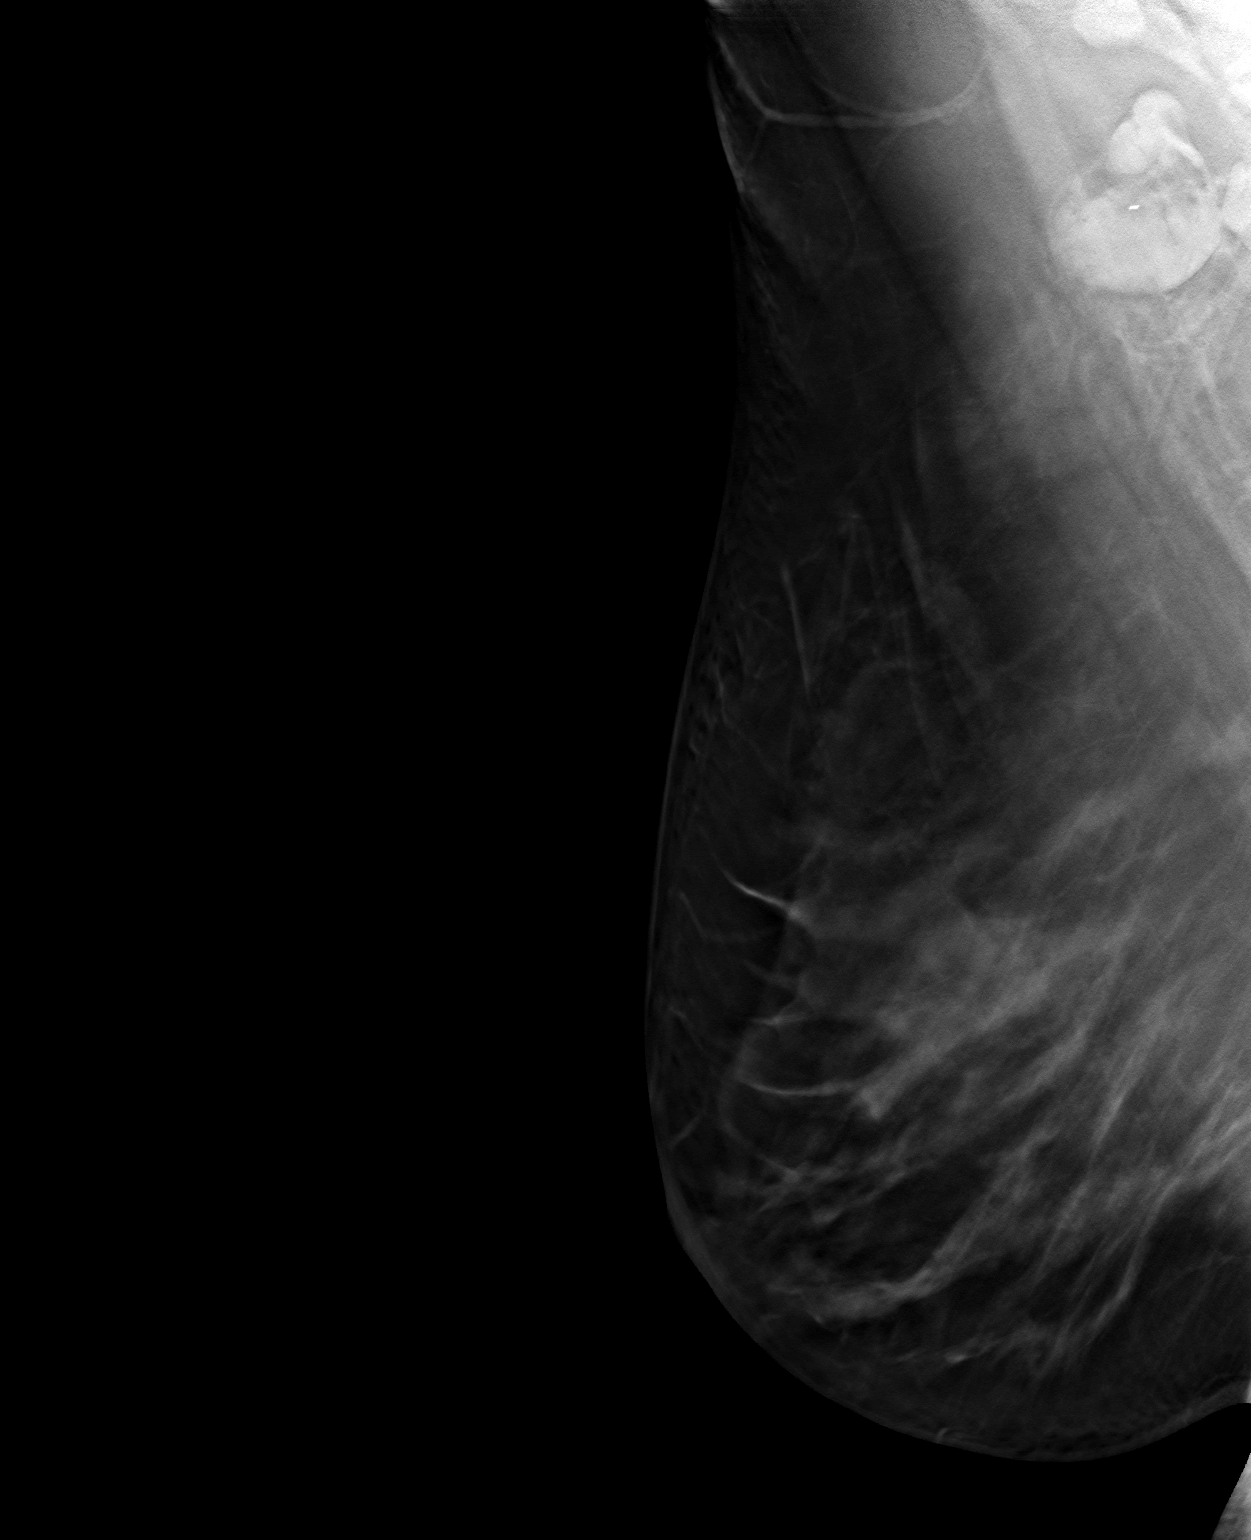

[3 of 6 positions shown; findings below may reference images not displayed]

FINDINGS: Mammographic images were obtained following ultrasound guided biopsy
of a left breast mass and bilateral axillary lymph nodes. The biopsy
marking clips are in expected position at the sites of biopsy.
IMPRESSION: 1. Appropriate positioning of the HydroMARK shape 4 biopsy marking
clip at the site of biopsy in the superior left breast.

2. Appropriate positioning of the HydroMARK shape 3 biopsy marking
clip at the site of biopsy in the left axilla.

3. Appropriate positioning of the HydroMARK shape 3 biopsy marking
clip at the site of biopsy in the right axilla.

Final Assessment: Post Procedure Mammograms for Marker Placement

## 2020-10-06 ENCOUNTER — Ambulatory Visit: Payer: Self-pay

## 2020-10-06 ENCOUNTER — Other Ambulatory Visit: Payer: Self-pay

## 2020-10-06 ENCOUNTER — Encounter: Payer: Self-pay | Admitting: Family Medicine

## 2020-10-06 ENCOUNTER — Ambulatory Visit (INDEPENDENT_AMBULATORY_CARE_PROVIDER_SITE_OTHER): Payer: BC Managed Care – PPO | Admitting: Family Medicine

## 2020-10-06 VITALS — Temp 98.3°F

## 2020-10-06 DIAGNOSIS — U071 COVID-19: Secondary | ICD-10-CM | POA: Diagnosis not present

## 2020-10-06 NOTE — Progress Notes (Signed)
MyChart Video Visit    Virtual Visit via Video Note   This visit type was conducted due to national recommendations for restrictions regarding the COVID-19 Pandemic (e.g. social distancing) in an effort to limit this patient's exposure and mitigate transmission in our community. This patient is at least at moderate risk for complications without adequate follow up. This format is felt to be most appropriate for this patient at this time. Physical exam was limited by quality of the video and audio technology used for the visit.   Patient location: home Provider location: [facility]  I discussed the limitations of evaluation and management by telemedicine and the availability of in person appointments. The patient expressed understanding and agreed to proceed.  Patient: Megan Spencer   DOB: 08-17-1981   40 y.o. Female  MRN: [id number] Visit Date: 10/06/2020  Today's healthcare provider: Mila Merry, MD   Chief Complaint  Patient presents with  . Cough   Subjective    Cough This is a new problem. Episode onset: 4 days ago. The cough is non-productive. Associated symptoms include chills, eye redness (right eye), a fever, headaches, myalgias, postnasal drip, rhinorrhea and a sore throat. Pertinent negatives include no chest pain, shortness of breath or wheezing. Treatments tried: Tylenol.    Pt. 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 Had Covid vaccines in March. Had some heart palpitations yesterday evening associated with cold chills which resolved when she got up and walked around. Last dose of Tylenol was yesterday around noon. No fevers today.     Medications: Outpatient Medications Prior to Visit  Medication Sig  . [DISCONTINUED] norethindrone (CAMILA) 0.35 MG tablet Take 1 tablet (0.35 mg total) by mouth daily. (Patient not taking: Reported on 10/06/2020)   No facility-administered medications prior to  visit.    Review of Systems  Constitutional: Positive for chills, diaphoresis, fatigue and fever. Negative for appetite change.  HENT: Positive for congestion, postnasal drip, rhinorrhea, sinus pressure, sinus pain, sneezing and sore throat.        Ear pressure  Eyes: Positive for redness (right eye). Negative for discharge.  Respiratory: Positive for cough (dry cough). Negative for chest tightness, shortness of breath and wheezing.   Cardiovascular: Positive for palpitations (occured intermittently last night). Negative for chest pain.  Gastrointestinal: Positive for diarrhea. Negative for abdominal pain, anal bleeding, nausea and vomiting.  Musculoskeletal: Positive for myalgias.  Neurological: Positive for headaches. Negative for dizziness and weakness.      Objective    Temp 98.3 F (36.8 C) (Oral)    Physical Exam    Awake, alert, oriented x 3. In no apparent distress    Assessment & Plan     1. COVID-19 Is to quarantine for a minimum of 5 days following onset of symptoms and 72 after last fever. Encouraged to monitor oxygen saturations and call or go to ER if worsening dyspnea, hypoxemia or other symptoms.   No follow-ups on file.     I discussed the assessment and treatment plan with the patient. The patient was provided an opportunity to ask questions and all were answered. The patient agreed with the plan and demonstrated an understanding of the instructions.   The patient was advised to call back or seek an in-person evaluation if the symptoms worsen or if the condition fails to improve as anticipated.  I provided 10 minutes of non-face-to-face time during this encounter.  The entirety of the information documented in the History of Present Illness, Review of Systems and Physical Exam were personally obtained by me. Portions of this information were initially documented by the CMA and reviewed by me for thoroughness and accuracy.     Mila Merry, MD [facility] [phone] (phone) [phone] (fax)  [facility]

## 2020-10-06 NOTE — Telephone Encounter (Signed)
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 Last night noticed periods of palpitations that came and went. None today. Spoke with Okey Regal in the practice. Virtual visit made.  Reason for Disposition . [1] Palpitations AND [2] no improvement after using CARE ADVICE  Answer Assessment - Initial Assessment Questions 1. DESCRIPTION: "Please describe your heart rate or heartbeat that you are having" (e.g., fast/slow, regular/irregular, skipped or extra beats, "palpitations")     Beating "weird" 2. ONSET: "When did it start?" (Minutes, hours or days)      Last night 3. DURATION: "How long does it last" (e.g., seconds, minutes, hours)     Minutes 4. PATTERN "Does it come and go, or has it been constant since it started?"  "Does it get worse with exertion?"   "Are you feeling it now?"     Comes and goes 5. TAP: "Using your hand, can you tap out what you are feeling on a chair or table in front of you, so that I can hear?" (Note: not all patients can do this)       No 6. HEART RATE: "Can you tell me your heart rate?" "How many beats in 15 seconds?"  (Note: not all patients can do this)       No 7. RECURRENT SYMPTOM: "Have you ever had this before?" If Yes, ask: "When was the last time?" and "What happened that time?"      No 8. CAUSE: "What do you think is causing the palpitations?"     Unsure - COVID 19 9. CARDIAC HISTORY: "Do you have any history of heart disease?" (e.g., heart attack, angina, bypass surgery, angioplasty, arrhythmia)      No 10. OTHER SYMPTOMS: "Do you have any other symptoms?" (e.g., dizziness, chest pain, sweating, difficulty breathing)       Yes- congestion, cough,body aches 11. PREGNANCY: "Is there any chance you are pregnant?" "When was your last menstrual period?"       No  Protocols used: HEART RATE AND HEARTBEAT QUESTIONS-A-AH

## 2020-10-08 NOTE — Patient Instructions (Signed)
.   Please review the attached list of medications and notify my office if there are any errors.   . Please bring all of your medications to every appointment so we can make sure that our medication list is the same as yours.   

## 2021-01-27 ENCOUNTER — Encounter: Payer: Self-pay | Admitting: Physician Assistant

## 2021-03-24 ENCOUNTER — Ambulatory Visit: Payer: BC Managed Care – PPO | Admitting: Family Medicine

## 2021-03-24 ENCOUNTER — Encounter: Payer: Self-pay | Admitting: Family Medicine

## 2021-03-24 ENCOUNTER — Other Ambulatory Visit: Payer: Self-pay

## 2021-03-24 VITALS — BP 141/74 | HR 104 | Temp 98.3°F | Resp 16 | Ht 64.0 in | Wt 213.4 lb

## 2021-03-24 DIAGNOSIS — Z0000 Encounter for general adult medical examination without abnormal findings: Secondary | ICD-10-CM | POA: Diagnosis not present

## 2021-03-24 DIAGNOSIS — Z1231 Encounter for screening mammogram for malignant neoplasm of breast: Secondary | ICD-10-CM

## 2021-03-24 DIAGNOSIS — R002 Palpitations: Secondary | ICD-10-CM | POA: Insufficient documentation

## 2021-03-24 DIAGNOSIS — Z1159 Encounter for screening for other viral diseases: Secondary | ICD-10-CM | POA: Diagnosis not present

## 2021-03-24 DIAGNOSIS — I10 Essential (primary) hypertension: Secondary | ICD-10-CM | POA: Diagnosis not present

## 2021-03-24 DIAGNOSIS — Z23 Encounter for immunization: Secondary | ICD-10-CM

## 2021-03-24 MED ORDER — TRIAMCINOLONE ACETONIDE 0.5 % EX OINT
1.0000 | TOPICAL_OINTMENT | Freq: Two times a day (BID) | CUTANEOUS | 0 refills | Status: DC
Start: 2021-03-24 — End: 2021-04-18

## 2021-03-24 MED ORDER — NORETHINDRONE 0.35 MG PO TABS: 1.0000 | ORAL_TABLET | Freq: Every day | ORAL | 3 refills | Status: DC

## 2021-03-24 NOTE — Assessment & Plan Note (Signed)
Not currently taking any medications Discussed lifestyle interventions Monitor home BPs F/u sooner if consistently >140/90

## 2021-03-24 NOTE — Progress Notes (Signed)
Complete physical exam   Patient: Megan Spencer   DOB: 11/07/1980   41 y.o. Female  MRN: [id number] Visit Date: 03/24/2021  Today's healthcare provider: Shirlee Latch, MD   Chief Complaint  Patient presents with   Annual Exam   Subjective    Megan Spencer is a 40 y.o. female who presents today for a complete physical exam.  She reports consuming a general diet. The patient does not participate in regular exercise at present. She generally feels well. She reports sleeping well. She does have additional problems to discuss today.  Patient is F/B Dr. 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 Her provider recommends humira however she is hesitant taking an immunosuppressant medication because she works as a Pension scheme manager.   HPI   Palpitations  Last summer she switched birth control due to occurrences of palpitations. She reports her symptoms resolved. However in January she was diagnosed with COVID and the palpitations returned. At night when she lays on either side she feels like her rhythm doesn't feel normal. Her palpitations occur 1x every other month.   Birth Control  Since she has stopped use of birth control she is interested in other progesterone only birth control options.   Screenings  01/22/2020 Pap/HPV-negative 04/02/2020 Mammogram-BI-RADS  04/26/2020 [place] LT BREAST-NEGATIVE for atypical proliferative breast disease  Vaccines  She is due for her tetanus vaccine. She has received 2 COVID vaccines.   History reviewed. No pertinent past medical history. Past Surgical History:  Procedure Laterality Date   BREAST BIOPSY Left 04/26/2020   u/s bx Hydomark 4, axilla hydromark 3, path pending   BREAST BIOPSY Right 04/26/2020   u/s bx hydromark 3, path pending   CESAREAN SECTION  2008   KNEE SURGERY Right 1998   arthroscopy   Social History   Socioeconomic History   Marital status: Divorced    Spouse name: Not on file    Number of children: Not on file   Years of education: Not on file   Highest education level: Not on file  Occupational History   Not on file  Tobacco Use   Smoking status: Former    Pack years: 0.00    Types: Cigarettes    Quit date: 09/17/2001    Years since quitting: 19.5   Smokeless tobacco: Never  Substance and Sexual Activity   Alcohol use: No   Drug use: No   Sexual activity: Not on file  Other Topics Concern   Not on file  Social History Narrative   Not on file   Social Determinants of Health   Financial Resource Strain: Not on file  Food Insecurity: Not on file  Transportation Needs: Not on file  Physical Activity: Not on file  Stress: Not on file  Social Connections: Not on file  Intimate Partner Violence: Not on file   Family Status  Relation Name Status   Mother  Alive   Father  Deceased at age 52       lung cancer   Sister  Alive   Mat Aunt  Deceased   MGM  (Not Specified)   Family History  Problem Relation Age of Onset   Fibroids Mother    Ulcerative colitis Mother    Cancer Father        lung cancer   Psoriasis Sister    Uterine cancer Maternal Aunt    Cervical cancer Maternal Aunt    Ulcerative colitis Maternal  Grandmother    Allergies  Allergen Reactions   Nickel Rash   Codeine    Neomycin-Bacitracin Zn-Polymyx    Sulfa Antibiotics    Tape     Patient Care Team: Maryella ShiversPollak, Adriana M, PA-C as PCP - General (Physician Assistant)   Medications: Outpatient Medications Prior to Visit  Medication Sig   [DISCONTINUED] prednisoLONE acetate (PRED FORTE) 1 % ophthalmic suspension Place 1 drop into the right eye 3 (three) times daily. (Patient not taking: Reported on 03/24/2021)   No facility-administered medications prior to visit.    Review of Systems  Constitutional:  Negative for chills, fatigue and fever.  HENT:  Negative for ear pain, sinus pressure, sinus pain and sore throat.   Eyes:  Negative for pain and visual disturbance.   Respiratory:  Negative for cough, chest tightness, shortness of breath and wheezing.   Cardiovascular:  Negative for chest pain, palpitations and leg swelling.  Gastrointestinal:  Negative for abdominal pain, blood in stool, diarrhea, nausea and vomiting.  Genitourinary:  Negative for flank pain, frequency, pelvic pain and urgency.  Musculoskeletal:  Negative for back pain, myalgias and neck pain.  Neurological:  Negative for dizziness, seizures, syncope, weakness, light-headedness, numbness and headaches.  All other systems reviewed and are negative.  Last CBC Lab Results  Component Value Date   WBC 7.9 01/22/2020   HGB 13.4 01/22/2020   HCT 39.6 01/22/2020   MCV 84 01/22/2020   MCH 28.3 01/22/2020   RDW 12.5 01/22/2020   PLT 203 01/22/2020   Last metabolic panel Lab Results  Component Value Date   GLUCOSE 93 01/22/2020   NA 139 01/22/2020   K 3.9 01/22/2020   CL 102 01/22/2020   CO2 22 01/22/2020   BUN 13 01/22/2020   CREATININE 0.91 01/22/2020   GFRNONAA 79 01/22/2020   GFRAA 91 01/22/2020   CALCIUM 9.3 01/22/2020   PROT 7.2 01/22/2020   ALBUMIN 4.2 01/22/2020   LABGLOB 3.0 01/22/2020   AGRATIO 1.4 01/22/2020   BILITOT 0.4 01/22/2020   ALKPHOS 77 01/22/2020   AST 16 01/22/2020   ALT 11 01/22/2020   Last lipids Lab Results  Component Value Date   CHOL 226 (H) 01/22/2020   HDL 51 01/22/2020   LDLCALC 156 (H) 01/22/2020   TRIG 107 01/22/2020   CHOLHDL 4.4 01/22/2020   Last thyroid functions Lab Results  Component Value Date   TSH 3.000 01/22/2020    Objective    BP (!) 141/74 (BP Location: Left Arm, Patient Position: Sitting, Cuff Size: Large)   Pulse (!) 104   Temp 98.3 F (36.8 C) (Oral)   Resp 16   Ht 5\' 4"  (1.626 m)   Wt 213 lb 6.4 oz (96.8 kg)   LMP 03/06/2021 (Exact Date)   SpO2 99%   BMI 36.63 kg/m   BP Readings from Last 3 Encounters:  03/24/21 (!) 141/74  04/29/20 132/86  04/06/20 134/82   Wt Readings from Last 3 Encounters:   03/24/21 213 lb 6.4 oz (96.8 kg)  04/29/20 217 lb 6.4 oz (98.6 kg)  04/06/20 219 lb 6.4 oz (99.5 kg)   Physical Exam Vitals reviewed.  Constitutional:      General: She is not in acute distress.    Appearance: Normal appearance. She is well-developed. She is not diaphoretic.  HENT:     Head: Normocephalic and atraumatic.     Right Ear: Tympanic membrane, ear canal and external ear normal.     Left Ear: Tympanic membrane, ear canal  and external ear normal.     Nose: Nose normal.     Mouth/Throat:     Mouth: Mucous membranes are moist.     Pharynx: Oropharynx is clear. No oropharyngeal exudate.  Eyes:     General: No scleral icterus.    Conjunctiva/sclera: Conjunctivae normal.     Pupils: Pupils are equal, round, and reactive to light.  Neck:     Thyroid: No thyromegaly.  Cardiovascular:     Rate and Rhythm: Normal rate and regular rhythm.     Pulses: Normal pulses.     Heart sounds: Normal heart sounds. No murmur heard. Pulmonary:     Effort: Pulmonary effort is normal. No respiratory distress.     Breath sounds: Normal breath sounds. No wheezing or rales.  Chest:  Breasts:    Right: Normal.     Left: Normal.     Comments: Breasts: breasts appear normal, no suspicious masses, no skin or nipple changes except bilaterally axillary nodes enlarged (chronic) Abdominal:     General: There is no distension.     Palpations: Abdomen is soft.     Tenderness: There is no abdominal tenderness.  Musculoskeletal:        General: No deformity.     Cervical back: Neck supple.     Right lower leg: No edema.     Left lower leg: No edema.  Lymphadenopathy:     Cervical: No cervical adenopathy.  Skin:    General: Skin is warm and dry.     Findings: No rash.  Neurological:     Mental Status: She is alert and oriented to person, place, and time. Mental status is at baseline.     Sensory: No sensory deficit.     Motor: No weakness.     Gait: Gait normal.  Psychiatric:        Mood and  Affect: Mood normal.        Behavior: Behavior normal.        Thought Content: Thought content normal.    Last depression screening scores PHQ 2/9 Scores 03/24/2021 01/22/2020 09/24/2018  PHQ - 2 Score 0 0 0  PHQ- 9 Score 0 1 1   Last fall risk screening Fall Risk  03/24/2021  Falls in the past year? 0  Number falls in past yr: 0  Injury with Fall? 0  Risk for fall due to : No Fall Risks  Follow up Falls evaluation completed   Last Audit-C alcohol use screening Alcohol Use Disorder Test (AUDIT) 03/24/2021  1. How often do you have a drink containing alcohol? 0  2. How many drinks containing alcohol do you have on a typical day when you are drinking? 0  3. How often do you have six or more drinks on one occasion? 0  AUDIT-C Score 0   A score of 3 or more in women, and 4 or more in men indicates increased risk for alcohol abuse, EXCEPT if all of the points are from question 1   No results found for any visits on 03/24/21.  Assessment & Plan     Problem List Items Addressed This Visit       Cardiovascular and Mediastinum   Hypertension    Not currently taking any medications Discussed lifestyle interventions Monitor home BPs F/u sooner if consistently >140/90       Relevant Orders   Comprehensive metabolic panel   Lipid Panel With LDL/HDL Ratio     Other   Annual physical exam -  Primary   Relevant Orders   Comprehensive metabolic panel   Lipid Panel With LDL/HDL Ratio   Palpitations    New problem Discussed possible etiologies -wide differential EKG today is benign Check labs zio patch Further eval/management pending zio results       Relevant Orders   EKG 12-Lead (Completed)   LONG TERM MONITOR (3-14 DAYS)   CBC w/Diff/Platelet   TSH   Other Visit Diagnoses     Encounter for hepatitis C screening test for low risk patient       Relevant Orders   Hepatitis C antibody   Encounter for screening mammogram for malignant neoplasm of breast       Relevant Orders    MM 3D SCREEN BREAST BILATERAL   Need for Td vaccine       Relevant Orders   Td vaccine greater than or equal to 7yo preservative free IM (Completed)       Routine Health Maintenance and Physical Exam  Exercise Activities and Dietary recommendations  Goals   None     Immunization History  Administered Date(s) Administered   Influenza,inj,Quad PF,6+ Mos 08/24/2015, 08/07/2017   Influenza-Unspecified 07/12/2016, 06/20/2018   PFIZER(Purple Top)SARS-COV-2 Vaccination 11/16/2019, 12/10/2019   Td 11/23/2010, 03/24/2021   Tdap 11/23/2010    Health Maintenance  Topic Date Due   Hepatitis C Screening  Never done   COVID-19 Vaccine (3 - Booster for Pfizer series) 05/11/2020   INFLUENZA VACCINE  04/18/2021   PAP SMEAR-Modifier  01/22/2023   TETANUS/TDAP  03/25/2031   HIV Screening  Completed   Pneumococcal Vaccine 87-39 Years old  Aged Out   HPV VACCINES  Aged Out   Discussed health benefits of physical activity, and encouraged her to engage in regular exercise appropriate for her age and condition.  Return in about 1 year (around 03/24/2022) for CPE.     I,Essence Turner,acting as a Neurosurgeon for Shirlee Latch, MD.,have documented all relevant documentation on the behalf of Shirlee Latch, MD,as directed by  Shirlee Latch, MD while in the presence of Shirlee Latch, MD.  I, Shirlee Latch, MD, have reviewed all documentation for this visit. The documentation on 03/24/21 for the exam, diagnosis, procedures, and orders are all accurate and complete.   Flonnie Wierman, Marzella Schlein, MD, MPH [facility]

## 2021-03-24 NOTE — Assessment & Plan Note (Signed)
New problem Discussed possible etiologies -wide differential EKG today is benign Check labs zio patch Further eval/management pending zio results

## 2021-03-24 NOTE — Patient Instructions (Signed)
Preventive Care 42-42 Years Old, Female Preventive care refers to lifestyle choices and visits with your health care provider that can promote health and wellness. This includes: A yearly physical exam. This is also called an annual wellness visit. Regular dental and eye exams. Immunizations. Screening for certain conditions. Healthy lifestyle choices, such as: Eating a healthy diet. Getting regular exercise. Not using drugs or products that contain nicotine and tobacco. Limiting alcohol use. What can I expect for my preventive care visit? Physical exam Your health care provider will check your: Height and weight. These may be used to calculate your BMI (body mass index). BMI is a measurement that tells if you are at a healthy weight. Heart rate and blood pressure. Body temperature. Skin for abnormal spots. Counseling Your health care provider may ask you questions about your: Past medical problems. Family's medical history. Alcohol, tobacco, and drug use. Emotional well-being. Home life and relationship well-being. Sexual activity. Diet, exercise, and sleep habits. Work and work Statistician. Access to firearms. Method of birth control. Menstrual cycle. Pregnancy history. What immunizations do I need?  Vaccines are usually given at various ages, according to a schedule. Your health care provider will recommend vaccines for you based on your age, medicalhistory, and lifestyle or other factors, such as travel or where you work. What tests do I need? Blood tests Lipid and cholesterol levels. These may be checked every 5 years, or more often if you are over 42 years old. Hepatitis C test. Hepatitis B test. Screening Lung cancer screening. You may have this screening every year starting at age 30 if you have a 30-pack-year history of smoking and currently smoke or have quit within the past 15 years. Colorectal cancer screening. All adults should have this screening starting at  age 23 and continuing until age 3. Your health care provider may recommend screening at age 88 if you are at increased risk. You will have tests every 1-10 years, depending on your results and the type of screening test. Diabetes screening. This is done by checking your blood sugar (glucose) after you have not eaten for a while (fasting). You may have this done every 1-3 years. Mammogram. This may be done every 1-2 years. Talk with your health care provider about when you should start having regular mammograms. This may depend on whether you have a family history of breast cancer. BRCA-related cancer screening. This may be done if you have a family history of breast, ovarian, tubal, or peritoneal cancers. Pelvic exam and Pap test. This may be done every 3 years starting at age 42. Starting at age 42, this may be done every 5 years if you have a Pap test in combination with an HPV test. Other tests STD (sexually transmitted disease) testing, if you are at risk. Bone density scan. This is done to screen for osteoporosis. You may have this scan if you are at high risk for osteoporosis. Talk with your health care provider about your test results, treatment options,and if necessary, the need for more tests. Follow these instructions at home: Eating and drinking  Eat a diet that includes fresh fruits and vegetables, whole grains, lean protein, and low-fat dairy products. Take vitamin and mineral supplements as recommended by your health care provider. Do not drink alcohol if: Your health care provider tells you not to drink. You are pregnant, may be pregnant, or are planning to become pregnant. If you drink alcohol: Limit how much you have to 0-1 drink a day. Be aware  of how much alcohol is in your drink. In the [place] one drink equals one 12 oz bottle of beer (355 mL), one 5 oz glass of wine (148 mL), or one 1 oz glass of hard liquor (44 mL).  Lifestyle Take daily care of your teeth and  gums. Brush your teeth every morning and night with fluoride toothpaste. Floss one time each day. Stay active. Exercise for at least 30 minutes 5 or more days each week. Do not use any products that contain nicotine or tobacco, such as cigarettes, e-cigarettes, and chewing tobacco. If you need help quitting, ask your health care provider. Do not use drugs. If you are sexually active, practice safe sex. Use a condom or other form of protection to prevent STIs (sexually transmitted infections). If you do not wish to become pregnant, use a form of birth control. If you plan to become pregnant, see your health care provider for a prepregnancy visit. If told by your health care provider, take low-dose aspirin daily starting at age 29. Find healthy ways to cope with stress, such as: Meditation, yoga, or listening to music. Journaling. Talking to a trusted person. Spending time with friends and family. Safety Always wear your seat belt while driving or riding in a vehicle. Do not drive: If you have been drinking alcohol. Do not ride with someone who has been drinking. When you are tired or distracted. While texting. Wear a helmet and other protective equipment during sports activities. If you have firearms in your house, make sure you follow all gun safety procedures. What's next? Visit your health care provider once a year for an annual wellness visit. Ask your health care provider how often you should have your eyes and teeth checked. Stay up to date on all vaccines. This information is not intended to replace advice given to you by your health care provider. Make sure you discuss any questions you have with your healthcare provider. Document Revised: 06/08/2020 Document Reviewed: 05/16/2018 Elsevier Patient Education  2022 [employer].

## 2021-04-12 ENCOUNTER — Telehealth (INDEPENDENT_AMBULATORY_CARE_PROVIDER_SITE_OTHER): Payer: BC Managed Care – PPO | Admitting: Family Medicine

## 2021-04-12 DIAGNOSIS — I471 Supraventricular tachycardia: Secondary | ICD-10-CM

## 2021-04-12 NOTE — Telephone Encounter (Signed)
Zio patch results received for patient.  Primary indication was palpitations.  She wore the device for 5 days and 19 hours from 7/7 to 04/06/2021.  Pulmonary findings show patient had a minimum heart rate of 44 bpm, max heart rate of 182 bpm, and average heart rate of 71 bpm.  Predominant underlying rhythm was sinus rhythm.  2 supraventricular tachycardia runs occurred, the run with the fastest interval lasting 4 beats with a max rate of 182 bpm, the longest lasting 6 beats with an average rate of 159 bpm.  Isolated SVE's were rare less than 1%, SVE couplets were rare less than 1%, and no SVE triplets were present.  Isolated VE's were rare less than 1% and no VE couplets or VE triplets were present.  Overall, this represents paroxysmal supraventricular tachycardia.  This is common and correlates with her symptoms of palpitations.  She could consider a low-dose beta-blocker like metoprolol if she remains symptomatic.  She should avoid caffeine, stress, alcohol, tobacco.  She should get regular sleep and exercise.  Daymion Nazaire, Marzella Schlein, MD, MPH [facility]

## 2021-04-13 NOTE — Telephone Encounter (Signed)
Advised patient of report below, she states that she would consider a beta-blocker, she wanted to know from you if report tells why these episodes of rapid heartbeat/palpitations are occurring? Patient has been advised that you are out of office today and will return back in the morning to address. KW

## 2021-04-14 NOTE — Addendum Note (Signed)
Addended by: Fonda Kinder on: 04/14/2021 02:44 PM   Modules accepted: Orders

## 2021-04-14 NOTE — Telephone Encounter (Signed)
Ok to place referral. Do not send in Rx. She can discuss with them

## 2021-04-14 NOTE — Telephone Encounter (Signed)
No it doesn't. SVT is common and occurs without a clear "cause" for most people. Caffeine, alcohol, and smoking can contribute. Ok to send in Metoprolol 12.5mg  BID for 48m supply. Schedule f/u appt in 3 m to reassess.

## 2021-04-14 NOTE — Telephone Encounter (Signed)
Advised patient as below, she states "her response does not sit well with me", and she states that she wants to know what is going on with her heart and cause for elevated heart rate in detail. She is requesting for referral to cardiology. KW

## 2021-04-18 ENCOUNTER — Other Ambulatory Visit: Payer: Self-pay

## 2021-04-18 ENCOUNTER — Encounter: Payer: Self-pay | Admitting: Cardiology

## 2021-04-18 ENCOUNTER — Ambulatory Visit: Payer: BC Managed Care – PPO | Admitting: Cardiology

## 2021-04-18 VITALS — BP 138/90 | HR 87 | Ht 64.0 in | Wt 208.0 lb

## 2021-04-18 DIAGNOSIS — I471 Supraventricular tachycardia: Secondary | ICD-10-CM

## 2021-04-18 DIAGNOSIS — E7800 Pure hypercholesterolemia, unspecified: Secondary | ICD-10-CM

## 2021-04-18 NOTE — Patient Instructions (Signed)
Medication Instructions:  - Your physician recommends that you continue on your current medications as directed. Please refer to the Current Medication list given to you today.  *If you need a refill on your cardiac medications before your next appointment, please call your pharmacy*   Lab Work: - none ordered  If you have labs (blood work) drawn today and your tests are completely normal, you will receive your results only by: MyChart Message (if you have MyChart) OR A paper copy in the mail If you have any lab test that is abnormal or we need to change your treatment, we will call you to review the results.   Testing/Procedures: - none ordered   Follow-Up: At [facility], you and your health needs are our priority.  As part of our continuing mission to provide you with exceptional heart care, we have created designated Provider Care Teams.  These Care Teams include your primary Cardiologist (physician) and Advanced Practice Providers (APPs -  Physician Assistants and Nurse Practitioners) who all work together to provide you with the care you need, when you need it.  We recommend signing up for the patient portal called "MyChart".  Sign up information is provided on this After Visit Summary.  MyChart is used to connect with patients for Virtual Visits (Telemedicine).  Patients are able to view lab/test results, encounter notes, upcoming appointments, etc.  Non-urgent messages can be sent to your provider as well.   To learn more about what you can do with MyChart, go to [email or web address].    Your next appointment:   As needed   The format for your next appointment:   In Person  Provider:   You may see Brian Agbor-Etang, MD or one of the following Advanced Practice Providers on your designated Care Team:   Christopher Berge, NP Ryan Dunn, PA-C Jacquelyn Visser, PA-C Cadence Furth, PA-C   Other Instructions N/a  

## 2021-04-18 NOTE — Progress Notes (Signed)
Cardiology Office Note:    Date:  04/18/2021   ID:  Megan Spencer, DOB 12/08/1980, MRN [id number]  PCP:  Trey Sailors, PA-C   [facility] Providers Cardiologist:  None     Referring MD: Erasmo Downer, MD   Chief Complaint  Patient presents with   New Patient (Initial Visit)    Referred by PCP for PSVT. Meds reviewed verbally with patient.    Megan Spencer is a 40 y.o. female who is being seen today for the evaluation of SVT at the request of Bacigalupo, Marzella Schlein, MD.   History of Present Illness:    Megan Spencer is a 40 y.o. female with history of hyperlipidemia, who presents due to SVT.  Patient with history of palpitations, saw her primary care provider where cardiac monitor was placed and she worried for about 2 weeks.  Results noted 2 paroxysmal SVTs , lasting 4 and 6 beats each.  No other significant arrhythmias were noted.  She otherwise feels fine, states palpitations/skipped heartbeats occur maybe once every other month, denies dizziness, chest pain, shortness of breath.  History reviewed. No pertinent past medical history.  Past Surgical History:  Procedure Laterality Date   BREAST BIOPSY Left 04/26/2020   u/s bx Hydomark 4, axilla hydromark 3, path pending   BREAST BIOPSY Right 04/26/2020   u/s bx hydromark 3, path pending   CESAREAN SECTION  2008   KNEE SURGERY Right 1998   arthroscopy    Current Medications: No outpatient medications have been marked as taking for the 04/18/21 encounter (Office Visit) with Debbe Odea, MD.     Allergies:   Nickel, Codeine, Neomycin-bacitracin zn-polymyx, Sulfa antibiotics, and Tape   Social History   Socioeconomic History   Marital status: Divorced    Spouse name: Not on file   Number of children: Not on file   Years of education: Not on file   Highest education level: Not on file  Occupational History   Not on file  Tobacco Use   Smoking status: Former    Types: Cigarettes    Quit date:  09/17/2001    Years since quitting: 19.5   Smokeless tobacco: Never  Substance and Sexual Activity   Alcohol use: No   Drug use: No   Sexual activity: Not on file  Other Topics Concern   Not on file  Social History Narrative   Not on file   Social Determinants of Health   Financial Resource Strain: Not on file  Food Insecurity: Not on file  Transportation Needs: Not on file  Physical Activity: Not on file  Stress: Not on file  Social Connections: Not on file     Family History: The patient's family history includes Cancer in her father; Cervical cancer in her maternal aunt; Fibroids in her mother; Psoriasis in her sister; Ulcerative colitis in her maternal grandmother and mother; Uterine cancer in her maternal aunt.  ROS:   Please see the history of present illness.     All other systems reviewed and are negative.  EKGs/Labs/Other Studies Reviewed:    The following studies were reviewed today:   EKG:  EKG is  ordered today.  The ekg ordered today demonstrates normal sinus rhythm, normal ECG  Recent Labs: No results found for requested labs within last 8760 hours.  Recent Lipid Panel    Component Value Date/Time   CHOL 226 (H) 01/22/2020 0852   TRIG 107 01/22/2020 0852   HDL 51 01/22/2020 0852  CHOLHDL 4.4 01/22/2020 0852   LDLCALC 156 (H) 01/22/2020 0852     Risk Assessment/Calculations:          Physical Exam:    VS:  BP 138/90 (BP Location: Left Arm, Patient Position: Sitting, Cuff Size: Large)   Pulse 87   Ht 5\' 4"  (1.626 m)   Wt 208 lb (94.3 kg)   SpO2 99%   BMI 35.70 kg/m     Wt Readings from Last 3 Encounters:  04/18/21 208 lb (94.3 kg)  03/24/21 213 lb 6.4 oz (96.8 kg)  04/29/20 217 lb 6.4 oz (98.6 kg)     GEN:  Well nourished, well developed in no acute distress HEENT: Normal NECK: No JVD; No carotid bruits LYMPHATICS: No lymphadenopathy CARDIAC: RRR, no murmurs, rubs, gallops RESPIRATORY:  Clear to auscultation without rales,  wheezing or rhonchi  ABDOMEN: Soft, non-tender, non-distended MUSCULOSKELETAL:  No edema; No deformity  SKIN: Warm and dry NEUROLOGIC:  Alert and oriented x 3 PSYCHIATRIC:  Normal affect   ASSESSMENT:    1. Paroxysmal SVT (supraventricular tachycardia) (HCC)   2. Pure hypercholesterolemia    PLAN:    In order of problems listed above:  Rare palpitations, cardiac monitor showed 2 paroxysmal SVT lasting 4 and 6 beats, patient asymptomatic during these.  Overall benign cardiac monitor.  As patient is largely asymptomatic and no significant arrhythmias, recommend watching patient.  If symptoms become more persistent, may consider reordering cardiac monitor and beta-blocker at that point. Hyperlipidemia, 10-year ASCVD risk 1.1%, low-cholesterol diet, exercise advised.  Follow-up as needed     Medication Adjustments/Labs and Tests Ordered: Current medicines are reviewed at length with the patient today.  Concerns regarding medicines are outlined above.  Orders Placed This Encounter  Procedures   EKG 12-Lead   No orders of the defined types were placed in this encounter.   Patient Instructions  Medication Instructions:  - Your physician recommends that you continue on your current medications as directed. Please refer to the Current Medication list given to you today.  *If you need a refill on your cardiac medications before your next appointment, please call your pharmacy*   Lab Work: - none ordered  If you have labs (blood work) drawn today and your tests are completely normal, you will receive your results only by: MyChart Message (if you have MyChart) OR A paper copy in the mail If you have any lab test that is abnormal or we need to change your treatment, we will call you to review the results.   Testing/Procedures: - none ordered   Follow-Up: At [facility], you and your health needs are our priority.  As part of our continuing mission to provide you with  exceptional heart care, we have created designated Provider Care Teams.  These Care Teams include your primary Cardiologist (physician) and Advanced Practice Providers (APPs -  Physician Assistants and Nurse Practitioners) who all work together to provide you with the care you need, when you need it.  We recommend signing up for the patient portal called "MyChart".  Sign up information is provided on this After Visit Summary.  MyChart is used to connect with patients for Virtual Visits (Telemedicine).  Patients are able to view lab/test results, encounter notes, upcoming appointments, etc.  Non-urgent messages can be sent to your provider as well.   To learn more about what you can do with MyChart, go to [facility].    Your next appointment:   As needed   The format  for your next appointment:   In Person  Provider:   You may see Debbe Odea, MD or one of the following Advanced Practice Providers on your designated Care Team:   Nicolasa Ducking, NP Eula Listen, PA-C Marisue Ivan, PA-C Cadence Proctor, [place]   Other Instructions N/a   Signed, Debbe Odea, MD  04/18/2021 12:43 PM    [facility]

## 2021-05-03 LAB — COMPREHENSIVE METABOLIC PANEL
ALT: 16 IU/L (ref 0–32)
AST: 18 IU/L (ref 0–40)
Albumin/Globulin Ratio: 2 (ref 1.2–2.2)
Albumin: 4.6 g/dL (ref 3.8–4.8)
Alkaline Phosphatase: 83 IU/L (ref 44–121)
BUN/Creatinine Ratio: 5 — ABNORMAL LOW (ref 9–23)
BUN: 4 mg/dL — ABNORMAL LOW (ref 6–24)
Bilirubin Total: 0.5 mg/dL (ref 0.0–1.2)
CO2: 22 mmol/L (ref 20–29)
Calcium: 9.4 mg/dL (ref 8.7–10.2)
Chloride: 102 mmol/L (ref 96–106)
Creatinine, Ser: 0.78 mg/dL (ref 0.57–1.00)
Globulin, Total: 2.3 g/dL (ref 1.5–4.5)
Glucose: 101 mg/dL — ABNORMAL HIGH (ref 65–99)
Potassium: 4.2 mmol/L (ref 3.5–5.2)
Sodium: 140 mmol/L (ref 134–144)
Total Protein: 6.9 g/dL (ref 6.0–8.5)
eGFR: 97 mL/min/{1.73_m2} (ref >59)

## 2021-05-03 LAB — CBC WITH DIFFERENTIAL/PLATELET
Basophils Absolute: 0 10*3/uL (ref 0.0–0.2)
Basos: 0 %
EOS (ABSOLUTE): 0.1 10*3/uL (ref 0.0–0.4)
Eos: 2 %
Hematocrit: 40.2 % (ref 34.0–46.6)
Hemoglobin: 13.1 g/dL (ref 11.1–15.9)
Immature Grans (Abs): 0 10*3/uL (ref 0.0–0.1)
Immature Granulocytes: 0 %
Lymphocytes Absolute: 2.4 10*3/uL (ref 0.7–3.1)
Lymphs: 47 %
MCH: 27.6 pg (ref 26.6–33.0)
MCHC: 32.6 g/dL (ref 31.5–35.7)
MCV: 85 fL (ref 79–97)
Monocytes Absolute: 0.5 10*3/uL (ref 0.1–0.9)
Monocytes: 10 %
Neutrophils Absolute: 2.1 10*3/uL (ref 1.4–7.0)
Neutrophils: 41 %
Platelets: 174 10*3/uL (ref 150–450)
RBC: 4.75 x10E6/uL (ref 3.77–5.28)
RDW: 12.7 % (ref 11.7–15.4)
WBC: 5.1 10*3/uL (ref 3.4–10.8)

## 2021-05-03 LAB — LIPID PANEL WITH LDL/HDL RATIO
Cholesterol, Total: 183 mg/dL (ref 100–199)
HDL: 128 mg/dL (ref >39)
LDL Chol Calc (NIH): 43 mg/dL (ref 0–99)
LDL/HDL Ratio: 0.3 ratio (ref 0.0–3.2)
Triglycerides: 60 mg/dL (ref 0–149)
VLDL Cholesterol Cal: 12 mg/dL (ref 5–40)

## 2021-05-03 LAB — TSH: TSH: 1.94 u[IU]/mL (ref 0.450–4.500)

## 2021-05-03 LAB — HEPATITIS C ANTIBODY: Hep C Virus Ab: <0.1 s/co ratio (ref 0.0–0.9)

## 2021-05-16 ENCOUNTER — Telehealth: Payer: Self-pay

## 2021-05-16 NOTE — Telephone Encounter (Signed)
Copied from CRM [id number]. Topic: General - Other >> May 16, 2021  4:57 PM Marylen Ponto wrote: Reason for CRM: Pt stated she received a letter from the heart monitor company stating they are unable to provide details of the heart monitor results due to missing information. Pt requests call back to discuss.

## 2022-03-02 ENCOUNTER — Other Ambulatory Visit: Payer: Self-pay | Admitting: Family Medicine

## 2022-05-21 ENCOUNTER — Other Ambulatory Visit: Payer: Self-pay | Admitting: Family Medicine

## 2022-08-19 ENCOUNTER — Other Ambulatory Visit: Payer: Self-pay | Admitting: Family Medicine

## 2022-08-21 NOTE — Telephone Encounter (Signed)
Is past Due for appointment. Need to be seen.

## 2022-10-31 ENCOUNTER — Other Ambulatory Visit: Payer: Self-pay | Admitting: Family Medicine

## 2022-10-31 NOTE — Telephone Encounter (Signed)
Medication Refill - Medication: norethindrone (MICRONOR) 0.35 MG tablet    Has the patient contacted their pharmacy? No. (Agent: If no, request that the patient contact the pharmacy for the refill. If patient does not wish to contact the pharmacy document the reason why and proceed with request.) (Agent: If yes, when and what did the pharmacy advise?)  Preferred Pharmacy (with phone number or street name):  [id number] [address] Phone: [phone] Fax: [phone]    Has the patient been seen for an appointment in the last year OR does the patient have an upcoming appointment? Yes.    Agent: Please be advised that RX refills may take up to 3 business days. We ask that you follow-up with your pharmacy.

## 2022-11-01 ENCOUNTER — Other Ambulatory Visit: Payer: Self-pay | Admitting: Family Medicine

## 2022-11-01 MED ORDER — NORETHINDRONE 0.35 MG PO TABS: 1.0000 | ORAL_TABLET | Freq: Every day | ORAL | 0 refills | Status: DC

## 2022-11-01 NOTE — Telephone Encounter (Signed)
Requested medication (s) are due for refill today: yes  Requested medication (s) are on the active medication list: yes  Last refill:    Future visit scheduled: yes  Notes to clinic:  Unable to refill per protocol, courtesy refill already given, routing for provider approval.      Requested Prescriptions  Pending Prescriptions Disp Refills   norethindrone (MICRONOR) 0.35 MG tablet 84 tablet 0    Sig: Take 1 tablet (0.35 mg total) by mouth daily. Please schedule office visit before any future refill.     OB/GYN: Contraceptives - Progestins Failed - 10/31/2022 11:53 AM      Failed - Last BP in normal range    BP Readings from Last 1 Encounters:  04/18/21 138/90         Failed - Valid encounter within last 12 months    Recent Outpatient Visits           1 year ago Annual physical exam   [place], Dionne Bucy, MD   2 years ago [place], Donald E, MD   2 years ago Lymphadenopathy   [facility] Trinna Post, [place]   2 years ago [place] Carles Collet M, [place]   2 years ago Hypertension, unspecified type   [facility] Trinna Post, [place]       Future Appointments             In 1 month Bacigalupo, Dionne Bucy, MD [facility], [place] - Patient is not a smoker

## 2022-11-02 NOTE — Telephone Encounter (Signed)
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 Please advise  CB- [phone]

## 2022-12-15 NOTE — Progress Notes (Unsigned)
I,Alexiah Koroma S Carlie Solorzano,acting as a Education administrator for Lavon Paganini, MD.,have documented all relevant documentation on the behalf of Lavon Paganini, MD,as directed by  Lavon Paganini, MD while in the presence of Lavon Paganini, MD.    Complete physical exam   Patient: Megan Spencer   DOB: 07-Nov-1980   42 y.o. Female  MRN: [id number] Visit Date: 12/18/2022  Today's healthcare provider: Lavon Paganini, MD   No chief complaint on file.  Subjective    Megan Spencer is a 42 y.o. female who presents today for a complete physical exam.  She reports consuming a {diet types:17450} diet. {Exercise:19826} She generally feels {well/fairly well/poorly:18703}. She reports sleeping {well/fairly well/poorly:18703}. She {does/does not:200015} have additional problems to discuss today.  HPI  01/22/20 Pap/HPV-negative   No past medical history on file. Past Surgical History:  Procedure Laterality Date   BREAST BIOPSY Left 04/26/2020   u/s bx Hydomark 4, axilla hydromark 3, path pending   BREAST BIOPSY Right 04/26/2020   u/s bx hydromark 3, path pending   CESAREAN SECTION  2008   KNEE SURGERY Right 1998   arthroscopy   Social History   Socioeconomic History   Marital status: Divorced    Spouse name: Not on file   Number of children: Not on file   Years of education: Not on file   Highest education level: Not on file  Occupational History   Not on file  Tobacco Use   Smoking status: Former    Types: Cigarettes    Quit date: 09/17/2001    Years since quitting: 21.2   Smokeless tobacco: Never  Substance and Sexual Activity   Alcohol use: No   Drug use: No   Sexual activity: Not on file  Other Topics Concern   Not on file  Social History Narrative   Not on file   Social Determinants of Health   Financial Resource Strain: Not on file  Food Insecurity: Not on file  Transportation Needs: Not on file  Physical Activity: Not on file  Stress: Not on file  Social Connections:  Not on file  Intimate Partner Violence: Not on file   Family Status  Relation Name Status   Mother  Alive   Father  Deceased at age 45       lung cancer   Sister  Alive   Mat Aunt  Deceased   MGM  (Not Specified)   Family History  Problem Relation Age of Onset   Fibroids Mother    Ulcerative colitis Mother    Cancer Father        lung cancer   Psoriasis Sister    Uterine cancer Maternal Aunt    Cervical cancer Maternal Aunt    Ulcerative colitis Maternal Grandmother    Allergies  Allergen Reactions   Nickel Rash   Codeine    Neomycin-Bacitracin Zn-Polymyx    Sulfa Antibiotics    Tape     Patient Care Team: Virginia Crews, MD as PCP - General (Family Medicine)   Medications: Outpatient Medications Prior to Visit  Medication Sig   norethindrone (MICRONOR) 0.35 MG tablet Take 1 tablet (0.35 mg total) by mouth daily. Please schedule office visit before any future refill.   No facility-administered medications prior to visit.    Review of Systems  All other systems reviewed and are negative.   Last CBC Lab Results  Component Value Date   WBC 5.1 05/02/2021   HGB 13.1 05/02/2021   HCT 40.2 05/02/2021  MCV 85 05/02/2021   MCH 27.6 05/02/2021   RDW 12.7 05/02/2021   PLT 174 [id number]   Last metabolic panel Lab Results  Component Value Date   GLUCOSE 101 (H) 05/02/2021   NA 140 05/02/2021   K 4.2 05/02/2021   CL 102 05/02/2021   CO2 22 05/02/2021   BUN 4 (L) 05/02/2021   CREATININE 0.78 05/02/2021   EGFR 97 05/02/2021   CALCIUM 9.4 05/02/2021   PROT 6.9 05/02/2021   ALBUMIN 4.6 05/02/2021   LABGLOB 2.3 05/02/2021   AGRATIO 2.0 05/02/2021   BILITOT 0.5 05/02/2021   ALKPHOS 83 05/02/2021   AST 18 05/02/2021   ALT 16 05/02/2021   Last lipids Lab Results  Component Value Date   CHOL 183 05/02/2021   HDL 128 05/02/2021   LDLCALC 43 05/02/2021   TRIG 60 05/02/2021   CHOLHDL 4.4 01/22/2020   Last hemoglobin A1c No results found for:  "HGBA1C" Last thyroid functions Lab Results  Component Value Date   TSH 1.940 05/02/2021      Objective    There were no vitals taken for this visit. BP Readings from Last 3 Encounters:  04/18/21 138/90  03/24/21 (!) 141/74  04/29/20 132/86   Wt Readings from Last 3 Encounters:  04/18/21 208 lb (94.3 kg)  03/24/21 213 lb 6.4 oz (96.8 kg)  04/29/20 217 lb 6.4 oz (98.6 kg)       Physical Exam  ***  Last depression screening scores    03/24/2021   11:32 AM 01/22/2020    8:14 AM 09/24/2018    4:23 PM  PHQ 2/9 Scores  PHQ - 2 Score 0 0 0  PHQ- 9 Score 0 1 1   Last fall risk screening    03/24/2021   11:32 AM  Fall Risk   Falls in the past year? 0  Number falls in past yr: 0  Injury with Fall? 0  Risk for fall due to : No Fall Risks  Follow up Falls evaluation completed   Last Audit-C alcohol use screening    03/24/2021   11:31 AM  Alcohol Use Disorder Test (AUDIT)  1. How often do you have a drink containing alcohol? 0  2. How many drinks containing alcohol do you have on a typical day when you are drinking? 0  3. How often do you have six or more drinks on one occasion? 0  AUDIT-C Score 0   A score of 3 or more in women, and 4 or more in men indicates increased risk for alcohol abuse, EXCEPT if all of the points are from question 1   No results found for any visits on 12/18/22.  Assessment & Plan    Routine Health Maintenance and Physical Exam  Exercise Activities and Dietary recommendations  Goals   None     Immunization History  Administered Date(s) Administered   Influenza,inj,Quad PF,6+ Mos 08/24/2015, 08/07/2017   Influenza-Unspecified 07/12/2016, 06/20/2018   PFIZER(Purple Top)SARS-COV-2 Vaccination 11/16/2019, 12/10/2019   Td 11/23/2010, 03/24/2021   Tdap 11/23/2010    Health Maintenance  Topic Date Due   INFLUENZA VACCINE  04/18/2022   COVID-19 Vaccine (3 - 2023-24 season) 05/19/2022   PAP SMEAR-Modifier  01/22/2023   DTaP/Tdap/Td (4 - Td  or Tdap) 03/25/2031   Hepatitis C Screening  Completed   HIV Screening  Completed   HPV VACCINES  Aged Out    Discussed health benefits of physical activity, and encouraged her to engage in regular exercise appropriate for  her age and condition.  ***  No follow-ups on file.     {provider attestation***:1}   Lavon Paganini, MD  [facility] [phone] (phone) [phone] (fax)  [place]

## 2022-12-18 ENCOUNTER — Encounter: Payer: Self-pay | Admitting: Family Medicine

## 2022-12-18 ENCOUNTER — Other Ambulatory Visit (HOSPITAL_COMMUNITY)
Admission: RE | Admit: 2022-12-18 | Discharge: 2022-12-18 | Disposition: A | Discharge status: Home / Self Care | Payer: BC Managed Care – PPO | Source: Ambulatory Visit | Attending: Family Medicine | Admitting: Family Medicine

## 2022-12-18 ENCOUNTER — Ambulatory Visit (INDEPENDENT_AMBULATORY_CARE_PROVIDER_SITE_OTHER): Payer: BC Managed Care – PPO | Admitting: Family Medicine

## 2022-12-18 VITALS — BP 138/86 | HR 89 | Temp 98.0°F | Resp 16 | Ht 64.0 in | Wt 205.0 lb

## 2022-12-18 DIAGNOSIS — Z124 Encounter for screening for malignant neoplasm of cervix: Secondary | ICD-10-CM | POA: Diagnosis not present

## 2022-12-18 DIAGNOSIS — Z0000 Encounter for general adult medical examination without abnormal findings: Secondary | ICD-10-CM | POA: Diagnosis not present

## 2022-12-18 DIAGNOSIS — Z1231 Encounter for screening mammogram for malignant neoplasm of breast: Secondary | ICD-10-CM

## 2022-12-18 DIAGNOSIS — I10 Essential (primary) hypertension: Secondary | ICD-10-CM | POA: Diagnosis not present

## 2022-12-18 DIAGNOSIS — R739 Hyperglycemia, unspecified: Secondary | ICD-10-CM

## 2022-12-18 MED ORDER — NORETHINDRONE 0.35 MG PO TABS: 1.0000 | ORAL_TABLET | Freq: Every day | ORAL | 3 refills | Status: DC

## 2022-12-18 NOTE — Assessment & Plan Note (Signed)
Well controlled Continue lifestyle management - no meds Recheck metabolic panel

## 2022-12-19 ENCOUNTER — Ambulatory Visit
Admission: RE | Admit: 2022-12-19 | Discharge: 2022-12-19 | Disposition: A | Discharge status: Home / Self Care | Payer: BC Managed Care – PPO | Source: Ambulatory Visit | Attending: Family Medicine | Admitting: Family Medicine

## 2022-12-19 DIAGNOSIS — Z1231 Encounter for screening mammogram for malignant neoplasm of breast: Secondary | ICD-10-CM

## 2022-12-19 LAB — COMPREHENSIVE METABOLIC PANEL
ALT: 10 IU/L (ref 0–32)
AST: 15 IU/L (ref 0–40)
Albumin/Globulin Ratio: 1.6 (ref 1.2–2.2)
Albumin: 4.5 g/dL (ref 3.9–4.9)
Alkaline Phosphatase: 101 IU/L (ref 44–121)
BUN/Creatinine Ratio: 10 (ref 9–23)
BUN: 10 mg/dL (ref 6–24)
Bilirubin Total: 0.4 mg/dL (ref 0.0–1.2)
CO2: 22 mmol/L (ref 20–29)
Calcium: 9.3 mg/dL (ref 8.7–10.2)
Chloride: 100 mmol/L (ref 96–106)
Creatinine, Ser: 0.99 mg/dL (ref 0.57–1.00)
Globulin, Total: 2.9 g/dL (ref 1.5–4.5)
Glucose: 98 mg/dL (ref 70–99)
Potassium: 4 mmol/L (ref 3.5–5.2)
Sodium: 138 mmol/L (ref 134–144)
Total Protein: 7.4 g/dL (ref 6.0–8.5)
eGFR: 73 mL/min/{1.73_m2} (ref >59)

## 2022-12-19 LAB — HEMOGLOBIN A1C
Est. average glucose Bld gHb Est-mCnc: 111 mg/dL
Hgb A1c MFr Bld: 5.5 % (ref 4.8–5.6)

## 2022-12-19 LAB — LIPID PANEL WITH LDL/HDL RATIO
Cholesterol, Total: 213 mg/dL — ABNORMAL HIGH (ref 100–199)
HDL: 59 mg/dL (ref >39)
LDL Chol Calc (NIH): 140 mg/dL — ABNORMAL HIGH (ref 0–99)
LDL/HDL Ratio: 2.4 ratio (ref 0.0–3.2)
Triglycerides: 77 mg/dL (ref 0–149)
VLDL Cholesterol Cal: 14 mg/dL (ref 5–40)

## 2022-12-19 LAB — CYTOLOGY - PAP
Comment: NEGATIVE
Diagnosis: NEGATIVE
High risk HPV: NEGATIVE

## 2023-05-06 ENCOUNTER — Telehealth: Payer: BC Managed Care – PPO | Admitting: Nurse Practitioner

## 2023-05-06 DIAGNOSIS — L237 Allergic contact dermatitis due to plants, except food: Secondary | ICD-10-CM

## 2023-05-06 MED ORDER — PREDNISONE 10 MG PO TABS: ORAL_TABLET | ORAL | 0 refills | Status: DC

## 2023-05-06 NOTE — Progress Notes (Signed)
Virtual Visit Consent   Megan Spencer, you are scheduled for a virtual visit with a [facility] provider today. Just as with appointments in the office, your consent must be obtained to participate. Your consent will be active for this visit and any virtual visit you may have with one of our providers in the next 365 days. If you have a MyChart account, a copy of this consent can be sent to you electronically.  As this is a virtual visit, video technology does not allow for your provider to perform a traditional examination. This may limit your provider's ability to fully assess your condition. If your provider identifies any concerns that need to be evaluated in person or the need to arrange testing (such as labs, EKG, etc.), we will make arrangements to do so. Although advances in technology are sophisticated, we cannot ensure that it will always work on either your end or our end. If the connection with a video visit is poor, the visit may have to be switched to a telephone visit. With either a video or telephone visit, we are not always able to ensure that we have a secure connection.  By engaging in this virtual visit, you consent to the provision of healthcare and authorize for your insurance to be billed (if applicable) for the services provided during this visit. Depending on your insurance coverage, you may receive a charge related to this service.  I need to obtain your verbal consent now. Are you willing to proceed with your visit today? Megan Spencer has provided verbal consent on 05/06/2023 for a virtual visit (video or telephone). Claiborne Rigg, NP  Date: 05/06/2023 8:51 AM  Virtual Visit via Video Note   I, Claiborne Rigg, connected with  Megan Spencer  ([id number], 09-Nov-1980) on 05/06/23 at  8:45 AM EDT by a video-enabled telemedicine application and verified that I am speaking with the correct person using two identifiers.  Location: Patient: Virtual Visit Location Patient:  Home Provider: Virtual Visit Location Provider: Home Office   I discussed the limitations of evaluation and management by telemedicine and the availability of in person appointments. The patient expressed understanding and agreed to proceed.    History of Present Illness: Megan Spencer is a 42 y.o. who identifies as a female who was assigned female at birth, and is being seen today for contact dermatitis.  Ms. Ripberger has been experiencing painful blisters, pruritus and redness on the left forearm since coming in contact with poison ivy recently. Despite using OTC topical hydrocortisone and calamine the rash has persisted and is now also present on the right arm in a small area.   Problems:  Patient Active Problem List   Diagnosis Date Noted   Hypertension 03/24/2021   Palpitations 03/24/2021   Annual physical exam 08/03/2015   Allergic rhinitis 07/20/2015   IBS (irritable bowel syndrome) 07/19/2015    Allergies:  Allergies  Allergen Reactions   Nickel Rash   Codeine    Neomycin-Bacitracin Zn-Polymyx    Sulfa Antibiotics    Tape    Medications:  Current Outpatient Medications:    predniSONE (DELTASONE) 10 MG tablet, I am prescribing a two week course of steroids (37 tablets of 10 mg prednisone).  Days 1-4 take 4 tablets (40 mg) daily  Days 5-8 take 3 tablets (30 mg) daily, Days 9-11 take 2 tablets (20 mg) daily, Days 12-14 take 1 tablet (10 mg) daily., Disp: 37 tablet, Rfl: 0   norethindrone (MICRONOR)  0.35 MG tablet, Take 1 tablet (0.35 mg total) by mouth daily., Disp: 90 tablet, Rfl: 3  Observations/Objective: Patient is well-developed, well-nourished in no acute distress.  Resting comfortably  at home.  Head is normocephalic, atraumatic.  No labored breathing.  Speech is clear and coherent with logical content.  Patient is alert and oriented at baseline.  Large area of blisters and erythema on the left forearm.  Assessment and Plan: 1. Poison ivy - predniSONE  (DELTASONE) 10 MG tablet; I am prescribing a two week course of steroids (37 tablets of 10 mg prednisone).  Days 1-4 take 4 tablets (40 mg) daily  Days 5-8 take 3 tablets (30 mg) daily, Days 9-11 take 2 tablets (20 mg) daily, Days 12-14 take 1 tablet (10 mg) daily.  Dispense: 37 tablet; Refill: 0  Continue hydrocortisone or benadryl cream to affected area.  Follow Up Instructions: I discussed the assessment and treatment plan with the patient. The patient was provided an opportunity to ask questions and all were answered. The patient agreed with the plan and demonstrated an understanding of the instructions.  A copy of instructions were sent to the patient via MyChart unless otherwise noted below.    The patient was advised to call back or seek an in-person evaluation if the symptoms worsen or if the condition fails to improve as anticipated.  Time:  I spent 12 minutes with the patient via telehealth technology discussing the above problems/concerns.    Claiborne Rigg, NP

## 2023-05-06 NOTE — Patient Instructions (Addendum)
  Megan Spencer, thank you for joining Claiborne Rigg, NP for today's virtual visit.  While this provider is not your primary care provider (PCP), if your PCP is located in our provider database this encounter information will be shared with them immediately following your visit.   A [facility] MyChart account gives you access to today's visit and all your visits, tests, and labs performed at [facility] " click here if you don't have a [place] [facility] MyChart account or go to [email or web address]  Consent: (Patient) Megan Spencer provided verbal consent for this virtual visit at the beginning of the encounter.  Current Medications:  Current Outpatient Medications:    predniSONE (DELTASONE) 10 MG tablet, I am prescribing a two week course of steroids (37 tablets of 10 mg prednisone).  Days 1-4 take 4 tablets (40 mg) daily  Days 5-8 take 3 tablets (30 mg) daily, Days 9-11 take 2 tablets (20 mg) daily, Days 12-14 take 1 tablet (10 mg) daily., Disp: 37 tablet, Rfl: 0   norethindrone (MICRONOR) 0.35 MG tablet, Take 1 tablet (0.35 mg total) by mouth daily., Disp: 90 tablet, Rfl: 3   Medications ordered in this encounter:  Meds ordered this encounter  Medications   predniSONE (DELTASONE) 10 MG tablet    Sig: I am prescribing a two week course of steroids (37 tablets of 10 mg prednisone).  Days 1-4 take 4 tablets (40 mg) daily  Days 5-8 take 3 tablets (30 mg) daily, Days 9-11 take 2 tablets (20 mg) daily, Days 12-14 take 1 tablet (10 mg) daily.    Dispense:  37 tablet    Refill:  0    Order Specific Question:   Supervising Provider    Answer:   Merrilee Jansky [id number]     *If you need refills on other medications prior to your next appointment, please contact your pharmacy*  Follow-Up: Call back or seek an in-person evaluation if the symptoms worsen or if the condition fails to improve as anticipated.  [facility] [phone]  Other  Instructions Continue hydrocortisone or benadryl cream to affected area.   If you have been instructed to have an in-person evaluation today at a local Urgent Care facility, please use the link below. It will take you to a list of all of our available [facility], including address, phone number and hours of operation. Please do not delay care.  [facility]  If you or a family member do not have a primary care provider, use the link below to schedule a visit and establish care. When you choose a [facility] primary care physician or advanced practice provider, you gain a long-term partner in health. Find a Primary Care Provider  Learn more about [facility] in-office and virtual care options: [facility] - Get Care Now

## 2023-08-29 NOTE — Progress Notes (Unsigned)
Established patient visit  Patient: Megan Spencer   DOB: [birth year]   42 y.o. Female  MRN: [id number] Visit Date: 08/30/2023  Today's healthcare provider: Debera Lat, PA-C   No chief complaint on file.  Subjective       Discussed the use of AI scribe software for clinical note transcription with the patient, who gave verbal consent to proceed.  History of Present Illness               12/18/2022    8:34 AM 03/24/2021   11:32 AM 01/22/2020    8:14 AM  Depression screen PHQ 2/9  Decreased Interest 0 0 0  Down, Depressed, Hopeless 0 0 0  PHQ - 2 Score 0 0 0  Altered sleeping 0 0 0  Tired, decreased energy 0 0 1  Change in appetite 0 0 0  Feeling bad or failure about yourself  0 0 0  Trouble concentrating 0 0 0  Moving slowly or fidgety/restless 0 0 0  Suicidal thoughts 0 0 0  PHQ-9 Score 0 0 1  Difficult doing work/chores Not difficult at all Not difficult at all Not difficult at all      08/04/2016    3:57 PM  GAD 7 : Generalized Anxiety Score  Nervous, Anxious, on Edge 3  Control/stop worrying 1  Worry too much - different things 2  Trouble relaxing 3  Restless 2  Easily annoyed or irritable 3  Afraid - awful might happen 0  Total GAD 7 Score 14  Anxiety Difficulty Extremely difficult    Medications: Outpatient Medications Prior to Visit  Medication Sig   norethindrone (MICRONOR) 0.35 MG tablet Take 1 tablet (0.35 mg total) by mouth daily.   predniSONE (DELTASONE) 10 MG tablet I am prescribing a two week course of steroids (37 tablets of 10 mg prednisone).  Days 1-4 take 4 tablets (40 mg) daily  Days 5-8 take 3 tablets (30 mg) daily, Days 9-11 take 2 tablets (20 mg) daily, Days 12-14 take 1 tablet (10 mg) daily.   No facility-administered medications prior to visit.    Review of Systems  HENT:  Positive for congestion and ear pain.    All negative Except see HPI   {Insert previous labs (optional):23779} {See past labs  Heme  Chem  Endocrine   Serology  Results Review (optional):1}   Objective    There were no vitals taken for this visit. {Insert last BP/Wt (optional):23777}{See vitals history (optional):1}   Physical Exam Vitals reviewed.  Constitutional:      General: She is not in acute distress.    Appearance: Normal appearance. She is well-developed. She is not diaphoretic.  HENT:     Head: Normocephalic and atraumatic.  Eyes:     General: No scleral icterus.    Conjunctiva/sclera: Conjunctivae normal.  Neck:     Thyroid: No thyromegaly.  Cardiovascular:     Rate and Rhythm: Normal rate and regular rhythm.     Pulses: Normal pulses.     Heart sounds: Normal heart sounds. No murmur heard. Pulmonary:     Effort: Pulmonary effort is normal. No respiratory distress.     Breath sounds: Normal breath sounds. No wheezing, rhonchi or rales.  Musculoskeletal:     Cervical back: Neck supple.     Right lower leg: No edema.     Left lower leg: No edema.  Lymphadenopathy:     Cervical: No cervical adenopathy.  Skin:    General: Skin is warm  and dry.     Findings: No rash.  Neurological:     Mental Status: She is alert and oriented to person, place, and time. Mental status is at baseline.  Psychiatric:        Mood and Affect: Mood normal.        Behavior: Behavior normal.      No results found for any visits on 08/30/23.      Assessment and Plan             No orders of the defined types were placed in this encounter.   No follow-ups on file.   The patient was advised to call back or seek an in-person evaluation if the symptoms worsen or if the condition fails to improve as anticipated.  I discussed the assessment and treatment plan with the patient. The patient was provided an opportunity to ask questions and all were answered. The patient agreed with the plan and demonstrated an understanding of the instructions.  I, Debera Lat, PA-C have reviewed all documentation for this visit. The  documentation on 08/30/2023  for the exam, diagnosis, procedures, and orders are all accurate and complete.  Debera Lat, [facility], MMS [facility] [phone] (phone) [phone] (fax)  [facility]

## 2023-08-30 ENCOUNTER — Ambulatory Visit: Payer: BC Managed Care – PPO | Admitting: Physician Assistant

## 2023-08-30 VITALS — BP 137/73 | HR 86 | Temp 98.4°F | Ht 64.0 in | Wt 220.6 lb

## 2023-08-30 DIAGNOSIS — H6992 Unspecified Eustachian tube disorder, left ear: Secondary | ICD-10-CM

## 2023-11-20 ENCOUNTER — Other Ambulatory Visit: Payer: Self-pay | Admitting: Family Medicine

## 2023-11-20 NOTE — Telephone Encounter (Signed)
 Patient will need an office visit for additional refills. Requested Prescriptions  Pending Prescriptions Disp Refills   norethindrone (MICRONOR) 0.35 MG tablet [Pharmacy Med Name: NORETHINDRONE 0.35 MG TABLET] 84 tablet 0    Sig: TAKE 1 TABLET BY MOUTH EVERY DAY     OB/GYN: Contraceptives - Progestins Passed - 11/20/2023  5:50 PM      Passed - Last BP in normal range    BP Readings from Last 1 Encounters:  08/30/23 137/73         Passed - Valid encounter within last 12 months    Recent Outpatient Visits           2 months ago Dysfunction of left eustachian tube   [facility] [place], [place], PA-C   11 months ago Annual physical exam   [facility] [place], Marzella Schlein, MD   2 years ago Annual physical exam   [facility] [place], Marzella Schlein, MD   3 years ago COVID-19   [facility] Malva Limes, MD   3 years ago Lymphadenopathy   [facility] Osvaldo Angst [place], [place]              Passed - Patient is not a smoker

## 2024-02-21 ENCOUNTER — Other Ambulatory Visit: Payer: Self-pay | Admitting: Family Medicine

## 2024-02-22 NOTE — Telephone Encounter (Signed)
 Courtesy refill. Patient will need an office visit for additional refills.  Requested Prescriptions  Pending Prescriptions Disp Refills   norethindrone  (MICRONOR ) 0.35 MG tablet [Pharmacy Med Name: NORETHINDRONE  0.35 MG TABLET] 28 tablet 0    Sig: TAKE 1 TABLET BY MOUTH EVERY DAY     OB/GYN: Contraceptives - Progestins Failed - 02/22/2024  9:08 AM      Failed - Valid encounter within last 12 months    Recent Outpatient Visits   None            Passed - Last BP in normal range    BP Readings from Last 1 Encounters:  08/30/23 137/73         Passed - Patient is not a smoker

## 2024-02-27 ENCOUNTER — Other Ambulatory Visit: Payer: Self-pay | Admitting: Family Medicine

## 2024-02-27 DIAGNOSIS — Z1231 Encounter for screening mammogram for malignant neoplasm of breast: Secondary | ICD-10-CM

## 2024-03-05 ENCOUNTER — Ambulatory Visit
Admission: RE | Admit: 2024-03-05 | Discharge: 2024-03-05 | Disposition: A | Discharge status: Home / Self Care | Payer: Self-pay | Source: Ambulatory Visit | Attending: Family Medicine | Admitting: Family Medicine

## 2024-03-05 DIAGNOSIS — Z1231 Encounter for screening mammogram for malignant neoplasm of breast: Secondary | ICD-10-CM | POA: Insufficient documentation

## 2024-03-07 ENCOUNTER — Ambulatory Visit: Payer: Self-pay | Admitting: Family Medicine

## 2024-03-22 ENCOUNTER — Other Ambulatory Visit: Payer: Self-pay | Admitting: Family Medicine

## 2024-03-25 NOTE — Telephone Encounter (Signed)
 Called patient to scheduled appt for medication refills/ annual exam. No answer, LVMTCB [phone].

## 2024-03-25 NOTE — Telephone Encounter (Signed)
 Requested medication (s) are due for refill today: na  Requested medication (s) are on the active medication list: yes  Last refill:  02/22/24 #28 0 refills  Future visit scheduled: no   Notes to clinic:  last OV 12/18/22. Called patient to schedule appt for medication refills/ annual exam, no answer, LVMTCB . Do you want to give another courtesy refill?    Pharmacy comment: REQUEST FOR 90 DAYS PRESCRIPTION.        Requested Prescriptions  Pending Prescriptions Disp Refills   norethindrone  (MICRONOR ) 0.35 MG tablet [Pharmacy Med Name: NORETHINDRONE  0.35 MG TABLET] 84 tablet 1    Sig: TAKE 1 TABLET BY MOUTH EVERY DAY     OB/GYN: Contraceptives - Progestins Failed - 03/25/2024  2:36 PM      Failed - Valid encounter within last 12 months    Recent Outpatient Visits   None            Passed - Last BP in normal range    BP Readings from Last 1 Encounters:  08/30/23 137/73         Passed - Patient is not a smoker
